# Patient Record
Sex: Female | Born: 1975
Health system: Southern US, Community
[De-identification: ages and names within clinical notes are randomized; demographics above are authoritative.]

## PROBLEM LIST (undated history)

## (undated) DIAGNOSIS — E039 Hypothyroidism, unspecified: Secondary | ICD-10-CM

## (undated) DIAGNOSIS — S42409A Unspecified fracture of lower end of unspecified humerus, initial encounter for closed fracture: Secondary | ICD-10-CM

## (undated) HISTORY — DX: Unspecified fracture of lower end of unspecified humerus, initial encounter for closed fracture: S42.409A

---

## 2001-08-26 ENCOUNTER — Other Ambulatory Visit: Admission: RE | Admit: 2001-08-26 | Discharge: 2001-08-26 | Payer: Self-pay | Admitting: Obstetrics and Gynecology

## 2004-10-14 ENCOUNTER — Emergency Department (HOSPITAL_COMMUNITY): Admission: EM | Admit: 2004-10-14 | Discharge: 2004-10-14 | Payer: Self-pay | Admitting: Family Medicine

## 2005-05-20 ENCOUNTER — Emergency Department (HOSPITAL_COMMUNITY): Admission: EM | Admit: 2005-05-20 | Discharge: 2005-05-20 | Payer: Self-pay | Admitting: Family Medicine

## 2010-03-21 ENCOUNTER — Encounter: Payer: Self-pay | Admitting: Family Medicine

## 2010-04-02 ENCOUNTER — Ambulatory Visit: Payer: Self-pay | Admitting: Family Medicine

## 2010-04-02 DIAGNOSIS — M545 Low back pain, unspecified: Secondary | ICD-10-CM | POA: Insufficient documentation

## 2010-04-02 DIAGNOSIS — Q828 Other specified congenital malformations of skin: Secondary | ICD-10-CM | POA: Insufficient documentation

## 2010-04-02 DIAGNOSIS — E669 Obesity, unspecified: Secondary | ICD-10-CM | POA: Insufficient documentation

## 2010-04-02 DIAGNOSIS — O99215 Obesity complicating the puerperium: Secondary | ICD-10-CM

## 2010-04-04 ENCOUNTER — Ambulatory Visit: Payer: Self-pay | Admitting: Family Medicine

## 2010-04-04 ENCOUNTER — Encounter: Payer: Self-pay | Admitting: Family Medicine

## 2010-04-14 ENCOUNTER — Encounter: Payer: Self-pay | Admitting: Family Medicine

## 2010-04-14 DIAGNOSIS — R87610 Atypical squamous cells of undetermined significance on cytologic smear of cervix (ASC-US): Secondary | ICD-10-CM | POA: Insufficient documentation

## 2010-04-14 LAB — CONVERTED CEMR LAB: Pap Smear: UNDETERMINED

## 2010-04-24 ENCOUNTER — Ambulatory Visit: Payer: Self-pay | Admitting: Family Medicine

## 2010-09-16 ENCOUNTER — Ambulatory Visit: Payer: Self-pay

## 2010-10-08 ENCOUNTER — Encounter: Payer: Self-pay | Admitting: Family Medicine

## 2010-10-08 ENCOUNTER — Ambulatory Visit
Admission: RE | Admit: 2010-10-08 | Discharge: 2010-10-08 | Payer: Self-pay | Source: Home / Self Care | Attending: Family Medicine | Admitting: Family Medicine

## 2010-10-08 LAB — CONVERTED CEMR LAB
Chlamydia, DNA Probe: NEGATIVE
GC Probe Amp, Genital: NEGATIVE
Platelets: 302 10*3/uL (ref 150–400)
RDW: 13 % (ref 11.5–15.5)

## 2010-10-09 ENCOUNTER — Telehealth: Payer: Self-pay | Admitting: Family Medicine

## 2010-10-10 ENCOUNTER — Ambulatory Visit: Admit: 2010-10-10 | Payer: Self-pay | Admitting: Family Medicine

## 2010-11-04 NOTE — Miscellaneous (Signed)
Summary: ROI  ROI   Imported By: Clydell Hakim 04/02/2010 14:48:30  _____________________________________________________________________  External Attachment:    Type:   Image     Comment:   External Document

## 2010-11-04 NOTE — Miscellaneous (Signed)
Summary: Preload outside records  Clinical Lists Changes On 09/12/09 had neg pelvic ultrasound for a postpartum female Observations: Added new observation of NKA: T (04/02/2010 12:54) Added new observation of ALLERGY REV: Done (04/02/2010 12:54) Added new observation of PELVIS US: normal:   (09/12/2009 12:56)      Pelvic US  Procedure date:  09/12/2009  Findings:      normal:     Pelvic US  Procedure date:  09/12/2009  Findings:      normal:      Allergies (verified): No Known Drug Allergies

## 2010-11-04 NOTE — Letter (Signed)
Summary: Results Follow-up Letter  [facility]  [address]   [place], [place] [address]   Phone: [phone]  Fax: [phone]    04/14/2010  [address] [place], [place]  [address]  Dear Ms. Aundria Rud,   The following are the results of your recent test(s):  Test     Result     Pap Smear    Normal_______  Not Normal__X___       Comments: This is a bit complicated and you may want to call me or Google ASC-US.  Your PAP smear was not exactly normal, but it also wasn't bad.  You have something called ASC-US which stands for atypical squamus cells of uncertain significants.  I went further and did HPV testing on the specimen and you do NOT have any high risk HPV.  As such, your pap smear should revert back to normal.  The recommendation is to follow up with another PAP smear next year.    Sincerely,     Doralee Albino MD [facility]

## 2010-11-04 NOTE — Assessment & Plan Note (Signed)
Summary: NP/nutr. appt/eo   Vital Signs:  Patient profile:   35 year old female Height:      66 inches Weight:      178.5 pounds BMI:     28.91  Vitals Entered By: Mackenzie Almas PHD (April 24, 2010 10:37 AM)  History of Present Illness: Assessment:  Spent 60 minutes with pt.   Mackenzie Boyd has a h/o body image issues and some disordered eating thougths and behaviors.  She said she has dieted most of her adult life, and is the heaviest she's ever been.  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  Usual eating pattern includes bkfast, lunch, snack, dinner, and snack.  Lunch is sometimes more of a snack.  Everyday foods/beverages include caffeine-free diet soda, 1/2 c coffee, 6 oz low-fat [place] yogurt, peanut or almond butter, apple, dessert-type foods (usually 2 X day), and some kind of greens.  Currently taking PNVs and DHA.  Usual exercise routine includes resistance training w/ personal trainer once a week plus a "couch to 5K" training program, 3 X wk.  Now up to 25 minutes of straight running.  24-hr recall suggests intake of  ~1100 kcal: B (10:30 AM)- 6 oz [place] yogurt, diet soda, 5-6 wheat thins with 1 tbsp almond butter; Snk (AM)- ; L (1 PM)- 2 tbsp pimiento chs & tomato on [employer], water; Snks (PM)- 3 small oatmeal-cc cookies, 6 oz 2% milk, Fiber One bar; D (PM)- salad w/ dressing, 4 oz 2% milk. Yesterday was atypical in that bkfst was very late, and she didn't eat much dinner b/c she didn't feel well.  Mackenzie Boyd identifies sweets as a problem food for her, and mindless eating as an obstacle to weight loss.    Nutrition Diagnosis:  Inappropriate intake of types of carbohydrate (NI-53.3) related to sweets as evidenced by desserts usually eaten 2 X day despite pt's interest in weight loss.  Physical inactivity (NB-2.1) related to energy deficit needed for wt loss as evidenced by exercise limited to a total of 3-4 X wk.  Excessive  frequency of unconscious food choices and eating.    Intervention: See Patient Instructions.    Monitoring/Eval:  Dietary intake, body weight, and exercise at 4-wk F/U.     Allergies: No Known Drug Allergies   Complete Medication List: 1)  Tretinoin 0.025 % Crea (Tretinoin) .... Apply once daily.  disp 60 gram tube  Other Orders: Inital Assessment Each - [facility] ([address])  Patient Instructions: 1)  Planning ahead will help ensure better nutrition and appetite management.   2)  Be mindful of what (tempting) foods you choose to keep on hand.   3)  Continue on your exercise routine, and as feasible, add some walking to your running so you are exercising at least 5 X wk.  Remember that even 10 minutes of exercise is beneficial.   4)  Eat at least 3 meals and 1-2 snacks per day.  5)  Obtain twice as many veg's as protein or carbohydrate foods for both lunch and dinner. 6)  Remember the importance of VEGETABLES:  Goal is at least twice a day.   7)  MEALS:  Should look like, taste like, and feel like a distinct meal.   8)  Review Mackenzie Boyd's (or other) books.   9)  Hungry, Angry, Anxious, Lonely, Tired (HAALT) = red flags for poor food decisions.   10)  Please schedule a follow-up  appt for 3-4 wks.  If no appt available, call Dr. Gerilyn Boyd:  [phone].

## 2010-11-04 NOTE — Assessment & Plan Note (Signed)
Summary: read tb/after 4 pm/eo  Nurse Visit   Allergies: No Known Drug Allergies  PPD Results    Date of reading: 04/04/2010    Results: 0 mm    Interpretation: negative  Orders Added: 1)  No Charge Patient Arrived (NCPA0) [NCPA0]

## 2010-11-04 NOTE — Miscellaneous (Signed)
Summary: Last tetanus  Clinical Lists Changes  Observations: Added new observation of DM PROGRESS: N/A (04/04/2010 11:27) Added new observation of DM FSREVIEW: N/A (04/04/2010 11:27) Added new observation of HTN PROGRESS: N/A (04/04/2010 11:27) Added new observation of HTN FSREVIEW: N/A (04/04/2010 11:27) Added new observation of LIPID PROGRS: N/A (04/04/2010 11:27) Added new observation of LIPID FSREVW: N/A (04/04/2010 11:27) Added new observation of TD BOOSTER: Historical (10/05/2004 11:28)  Immunization History:  Tetanus/Td Immunization History:    Tetanus/Td:  historical (10/05/2004)       Prevention & Chronic Care Immunizations   Influenza vaccine: Not documented    Tetanus booster: 10/05/2004: Historical    Pneumococcal vaccine: Not documented  Other Screening   Pap smear: Not documented   Smoking status: never  (04/02/2010)

## 2010-11-04 NOTE — Assessment & Plan Note (Signed)
Summary: NEW PT/WELL WOMAN VISIT/[facility]   Vital Signs:  Patient profile:   35 year old female Height:      66 inches Weight:      181.9 pounds BMI:     29.47 Temp:     99.2 degrees F oral Pulse rate:   87 / minute BP sitting:   115 / 75  (left arm) Cuff size:   regular  Vitals Entered By: Gladstone Pih (April 02, 2010 3:13 PM) CC: NP---CPE and pap Is Patient Diabetic? No Pain Assessment Patient in pain? no        CC:  NP---CPE and pap.  History of Present Illness: Weight concerns.   Sig post partum wt gain.    Wants to try accupuncture for wt gain and low back pain  Breastfeeding now 22 month old son.  Wants Rx for keratosis pilaris  Last Pap 11/2008 Had hx of abnormal pap in 2007 but has three subsequent normals  Has record of last tetanus at home,  thinks in last 10 years.  Will check and call me with date.  Never cholesterol screen.  Had HIV screen with recent pregnancy    Habits & Providers  Alcohol-Tobacco-Diet     Alcohol drinks/day: <1     Alcohol Counseling: not indicated; patient does not drink     Tobacco Status: never     Diet Comments: trying to be healthy wants to see nutritionist  Exercise-Depression-Behavior     Does Patient Exercise: yes     Type of exercise: walking and jog     Exercise (avg: min/session): 30-60     Times/week: 3     Have you felt down or hopeless? no     Have you felt little pleasure in things? no     Depression Counseling: not indicated; screening negative for depression     STD Risk: never     Drug Use: never     Seat Belt Use: always     Sun Exposure: infrequent  Problems Prior to Update: 1)  Screening For Lipoid Disorders  (ICD-V77.91) 2)  Keratosis Pilaris  (ICD-757.39) 3)  Low Back Pain  (ICD-724.2) 4)  Obes Comp Pg Birth/the Pp Postpartum Cond/comp  (XBJ-478.29)  Medications Prior to Update: 1)  None  Current Medications (verified): 1)  Tretinoin 0.025 % Crea (Tretinoin) .... Apply Once Daily.  Disp 60 Gram  Tube  Allergies (verified): No Known Drug Allergies  Past History:  Past Medical History: none  Past Surgical History: Had Leep for abnormal pap age 71.    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    Physical Exam  General:  Well-developed,well-nourished,in no acute distress; alert,appropriate and cooperative throughout examination Head:  Normocephalic and atraumatic without obvious abnormalities. No apparent alopecia or balding. Mouth:  Oral mucosa and oropharynx without lesions or exudates.  Teeth in good repair. Neck:  No deformities, masses, or tenderness noted. Lungs:  Normal respiratory effort, chest expands symmetrically. Lungs are clear to auscultation, no crackles or wheezes. Heart:  Normal rate and regular rhythm. S1 and S2 normal without gallop, murmur, click, rub or  other extra sounds. Abdomen:  Bowel sounds positive,abdomen soft and non-tender without masses, organomegaly or hernias noted. Genitalia:  Normal introitus for age, no external lesions, no vaginal discharge, mucosa pink and moist, no vaginal or cervical lesions, no vaginal atrophy, no friaility or hemorrhage, normal uterus size and position, no adnexal masses or tenderness Extremities:  No clubbing, cyanosis, edema, or deformity noted with normal full range of motion of all joints.   Neurologic:  No cranial nerve deficits noted. Station and gait are normal. Plantar reflexes are down-going bilaterally. DTRs are symmetrical throughout. Sensory, motor and coordinative functions appear  intact.   Impression & Recommendations:  Problem # 1:  SCREENING FOR MALIGNANT NEOPLASM OF THE CERVIX (ICD-V76.2)  Orders: Pap Smear-[facility] ([id number])  Problem # 2:  SCREENING FOR LIPOID DISORDERS (ICD-V77.91)  Future Orders: Lipid-[facility] ([id number]) ... 03/06/2011  Problem # 3:  OBES COMP PG BIRTH/THE PP POSTPARTUM COND/COMP (QIO-962.95)  Orders: Nutrition Referral (Nutrition)  Problem # 4:  LOW BACK PAIN (ICD-724.2) accupuncture referral  Orders: Alternative Medicine Referral (Alt Med)  Complete Medication List: 1)  Tretinoin 0.025 % Crea (Tretinoin) .... Apply once daily.  disp 60 gram tube  Other Orders: TB Skin Test (28413) Admin 1st Vaccine (24401) Prescriptions: TRETINOIN 0.025 % CREA (TRETINOIN) Apply once daily.  Disp 60 gram tube  #60 x 12   Entered and Authorized by:   Doralee Albino MD   Signed by:   Doralee Albino MD on 04/02/2010   Method used:   [facility]        [facility]  [address] [facility] [address] [phone]* (retail)       [address]       [place], [place]  [address]       Ph: [phone] or [phone]       Fax: [phone]   RxID:   [id number]    Immunizations Administered:  PPD Skin Test:    Vaccine Type: PPD    Site: left forearm    Mfr: Sanofi Pasteur    Dose: 0.1 ml    Route: ID    Given by: Gladstone Pih    Exp. Date: 07/18/2011    Lot #: [id number]    Physician counseled: yes   Prevention & Chronic Care Immunizations   Influenza vaccine: Not documented    Tetanus booster: Not documented    Pneumococcal vaccine: Not documented  Other Screening   Pap smear: Not documented   Smoking status: never  (04/02/2010)   Appended Document: Orders Update-HPV charge    Clinical Lists Changes  Problems: Added new problem of ASCUS PAP (ICD-795.01) Orders: Added new Test order of HPV Typing-[facility] [phone]) - Signed

## 2010-11-06 ENCOUNTER — Encounter: Payer: Self-pay | Admitting: *Deleted

## 2010-11-06 NOTE — Progress Notes (Signed)
  Phone Note Outgoing Call Call back at [facility] Phone [phone]   Call placed by: Paula Compton MD,  October 09, 2010 12:15 PM Call placed to: Patient Summary of Call: Called patient, reported results of labs and cervical cultures (negative).  She reports resolution of the vaginal symptoms today after taking the Diflucan yesterday.  Will follow with her primary for concerns of hair loss and others. Initial call taken by: Paula Compton MD,  October 09, 2010 12:16 PM

## 2010-11-06 NOTE — Assessment & Plan Note (Signed)
Summary: vaginal infection/bmc   Vital Signs:  Patient profile:   35 year old female Height:      66 inches Weight:      174 pounds BMI:     28.19 Temp:     97.6 degrees F oral Pulse rate:   93 / minute BP sitting:   130 / 87  (left arm) Cuff size:   regular  Vitals Entered By: Tessie Fass CMA (October 08, 2010 10:53 AM)  Serial Vital Signs/Assessments:  Time      Position  BP       Pulse  Resp  Temp     By                     104/70                         Paula Compton MD  Comments: R arm sitting manual reading By: Paula Compton MD     History of Present Illness: Patient seen for acute visit, complaint of stinging sensation in vagina for about 2 days.  No notable discharge or odor.  No dysuria or change in urinary pattern or appearance.  No dyspareunia.  LMP Sep 13, 2010.  Not using hormonal forms of birth control.  Nursing her 28 month old son.  No prior diagnoses of STI, HSV2, vaginal yeast or BV.   Did have hx LEEP procedure in the past; most recent PAP about 6 months ago with ASCUS, without high-risk HPV at that time.  In addition to the above-mentioned complaints, Ms. Butsch brings up several other concerns:  1) feelings of anxiety and depression.  Is working to complete PhD dissertation in mental health, recognizes sxs in herself.  Denies SI or HI.  Poor sleep associated with her son's poor sleeping patterns.  Is able to cope, but "everything's a slog".  Some racing thoughts.  2) Hair loss, mostly over the forehead/hairline.  No scalp itch or excessive dandruff.  No family hx of thyroid disease.  Unable to shed the pregnancy weight gain, trying weight watchers for a few months without success.    Allergies: No Known Drug Allergies  Physical Exam  General:  well appearing, no apparent distress Head:  scalp without exclamation point hairs or patches of hair loss.  No abnormalities of scalp noted Eyes:  PERRL Neck:  Neck supple. No thyroid masses or megaly. No  cervical adenopathy Genitalia:  normal female genitalia.  Mild erythema of vaginal mucosa at vaginal vault.  Scant amount of clumpy white discharge.  No other mucosal  lesions, no vesicular lesions on external skin   Impression & Recommendations:  Problem # 1:  LEUKORRHEA NOT SPECIFIED AS INFECTIVE (ICD-623.5) Wet prep with yeast and some moderate clue cells.  Will opt to treat the yeast first, consider treatment with metronidazole if sxs not resolved.  I suspect the BV is an incidental finding at this point.  [facility] [phone]) GC/Chlamydia-[facility] (87591/87491) [facility]- Est  Level 4 ([address]782)  Problem # 2:  UNSPECIFIED ALOPECIA (ICD-704.00) Suspect telogen effluvium related to the multiple stressors that are present in her life (51-month old, poor sleep, PhD dissertation, possible situational depression or other mood disorder).  Will check TSH and CBC today, I will follow up with her on these.  She agrees to see Dr Leveda Anna for management of ongoing issues.   Orders: TSH-[facility] ([id number]) CBC-[facility] (78469) [facility]- Est  Level 4 (62952)  Problem # 3:  ASCUS PAP (ICD-795.01)  She agrees to followup for repeat PAP with Dr Leveda Anna in [place].  Abnormal PAP on next check warrants colposcopic evaluation.  Orders: [facility]- Est  Level 4 ([address]214)  Complete Medication List: 1)  Tretinoin 0.025 % Crea (Tretinoin) .... Apply once daily.  disp 60 gram tube 2)  Fluconazole 150 Mg Tabs (Fluconazole) .... Sig: take 1 tab by mouth one time only; may repeat in 1 week as needed  Patient Instructions: 1)  It was a pleasure to see you today.  I will follow up with you about the results of the labwork collected today.  2)  I ask that you followup wtih Dr Leveda Anna for continuity of care, and for the repeat PAP smear due in June of 2012. Prescriptions: FLUCONAZOLE 150 MG TABS (FLUCONAZOLE) SIG: Take 1 tab by mouth one time only; may repeat in 1 week as needed  #2 x 0   Entered and Authorized by:   Paula Compton MD    Signed by:   Paula Compton MD on 10/08/2010   Method used:   [facility]        [facility] [facility] [address] [phone]* (retail)       [address]       [place], [place]  [address]       Ph: [phone] or [phone]       Fax: [phone]   RxID:   [id number]    Orders Added: 1)  TSH-[facility] [phone] 2)  CBC-[facility] [85027] 3)  Wet Prep- [facility] [87210] 4)  GC/Chlamydia-[facility] [87591/87491] 5)  [facility]- Est  Level 4 [33295]    Laboratory Results  Date/Time Received: October 08, 2010 11:42 AM  Date/Time Reported: October 08, 2010 11:52 AM   Wet [place] Source: vag WBC/hpf: 1-5 Bacteria/hpf: 3+ rods and cocci Clue cells/hpf: moderate  Positive whiff Yeast/hpf: many Trichomonas/hpf: none Comments: ...............test performed by......Marland KitchenBonnie A. [place], MLS (ASCP)cm

## 2012-02-02 ENCOUNTER — Other Ambulatory Visit: Payer: Self-pay | Admitting: Family Medicine

## 2012-02-02 DIAGNOSIS — Q828 Other specified congenital malformations of skin: Secondary | ICD-10-CM

## 2012-02-02 MED ORDER — TRETINOIN 0.025 % EX CREA: TOPICAL_CREAM | Freq: Every day | CUTANEOUS | Status: DC

## 2012-02-02 NOTE — Assessment & Plan Note (Signed)
Refilled based on fax request.  Discussed with patient and she will see me soon.

## 2012-03-15 ENCOUNTER — Telehealth: Payer: Self-pay | Admitting: *Deleted

## 2012-03-15 NOTE — Telephone Encounter (Signed)
Form completed.

## 2012-03-15 NOTE — Telephone Encounter (Signed)
Faxed

## 2012-03-15 NOTE — Telephone Encounter (Signed)
PA  required for tretinoin.  Called pharmacy and they still have on file , waiting for PA because it still will not go through. Placed in MD box. ( Dr. Leveda Anna thought he had already done this previously )

## 2012-03-16 NOTE — Telephone Encounter (Signed)
Approval received for tretinoin .  Pharmacy notified.

## 2013-10-05 NOTE — L&D Delivery Note (Signed)
Delivery Note At 6:47 PM a viable and healthy female was delivered via Vaginal, Spontaneous Delivery (Presentation: OA;  ).  APGAR: 9, 9; weight 8 lb 11 oz (3941 g).   Placenta status: Intact, Spontaneous.  Cord: 3 vessels with the following complications: None.  Cord pH: NA Anesthesia: Epidural  Episiotomy: None Lacerations: 2nd degree Suture Repair: 3.0 vicryl rapide Est. Blood Loss (mL):   Mom to postpartum.  Baby to Couplet care / Skin to Skin.  Mackenzie Boyd R 04/30/2014, 8:56 PM

## 2013-11-15 ENCOUNTER — Ambulatory Visit (INDEPENDENT_AMBULATORY_CARE_PROVIDER_SITE_OTHER): Payer: BC Managed Care – PPO | Admitting: Sports Medicine

## 2013-11-15 VITALS — BP 121/75 | HR 106 | Temp 98.5°F | Ht 66.0 in | Wt 182.0 lb

## 2013-11-15 DIAGNOSIS — J111 Influenza due to unidentified influenza virus with other respiratory manifestations: Secondary | ICD-10-CM

## 2013-11-15 DIAGNOSIS — Z3480 Encounter for supervision of other normal pregnancy, unspecified trimester: Secondary | ICD-10-CM

## 2013-11-15 DIAGNOSIS — R69 Illness, unspecified: Principal | ICD-10-CM

## 2013-11-15 DIAGNOSIS — Z3482 Encounter for supervision of other normal pregnancy, second trimester: Secondary | ICD-10-CM

## 2013-11-15 LAB — INFLUENZA A AND B
INFLUENZA B AG: NEGATIVE
Inflenza A Ag: POSITIVE — AB

## 2013-11-15 MED ORDER — OSELTAMIVIR PHOSPHATE 75 MG PO CAPS: 75.0000 mg | ORAL_CAPSULE | Freq: Two times a day (BID) | ORAL | Status: DC

## 2013-11-15 NOTE — Assessment & Plan Note (Signed)
Currently [redacted] weeks pregnant.  Tamiflu Category C; pt seen by OB earlier today and would like testing performed.   Flu Test - sent STAT Tamiflu prescribed so will be available.  Pt to wait to pick up. Will call later with results. Continue supportive care with fluids and acetaminophen

## 2013-11-15 NOTE — Progress Notes (Signed)
  Mackenzie Boyd - 38 y.o. female MRN [id number]  Date of birth: 03/24/1976  Chief Complaint  Patient presents with  . flu like symptoms    x 1 day   Problems/Dx addressed during visit General Plan and Pt Instructions  1. Influenza-like illness   2. Prenatal care, subsequent pregnancy in second trimester   3. OBES COMP PG BIRTH/THE PP POSTPARTUM COND/COMP        Tamiflu     HPI & ROS    She reports less than 48 hours of body aches, fevers, chills.  Her husband was recently diagnosed with influenza and has been empirically treated with Tamiflu.  She is approximately [redacted] weeks pregnant.  Seen by her OB/GYN earlier today who requested she undergo influenza testing prior to starting Tamiflu.  Denies any bleeding, discharge.  Uncomplicated pregnancy thus far.  Taking Tylenol only, no NSAIDs.  Hydration status is good.  No dysuria, frequency or burning.  History  Past Medical, Surgical, Social, and Family History Reviewed per EMR Medications and Allergies reviewed and all updated if necessary. Objective Findings  VITALS: HR: 106 bpm  BP: 121/75 mmHg  TEMP: 98.5 F (36.9 C) (Oral)  RESP:    HT: 5\' 6"  (167.6 cm)  WT: 182 lb (82.555 kg)  BMI: 29.4   BP Readings from Last 3 Encounters:  11/15/13 121/75  10/08/10 130/87  04/02/10 115/75   Wt Readings from Last 3 Encounters:  11/15/13 182 lb (82.555 kg)  10/08/10 174 lb (78.926 kg)  04/24/10 178 lb 8 oz (80.967 kg)     PHYSICAL EXAM: GENERAL:  adult ill appearing female. In no discomfort; no respiratory distress, mildly pale   PSYCH: alert and appropriate, good insight   HNEENT:  no JVD, nasal erythema with mild discharge,   CARDIO:  tachycardic   LUNGS:  no increased work of breathing   ABDOMEN:   EXTREM:    GU:  fetal heart tones ranging from 160-165   SKIN:  good skin turgor     Medications, Labs & Other Orders   Previous Medications   FLUCONAZOLE (DIFLUCAN) 150 MG TABLET    Take 150 mg by mouth once. May  repeat in 1 wk prn    TRETINOIN (RETIN-A) 0.025 % CREAM    Apply topically daily.   Modified Medications   No medications on file   New Prescriptions   OSELTAMIVIR (TAMIFLU) 75 MG CAPSULE    Take 1 capsule (75 mg total) by mouth 2 (two) times daily.   Discontinued Medications   No medications on file   Orders Placed This Encounter  Procedures  . Influenza a and b   Assessment & Plan  As above & for further discussion see problem based charting if applicable.

## 2013-11-16 DIAGNOSIS — Z3482 Encounter for supervision of other normal pregnancy, second trimester: Secondary | ICD-10-CM | POA: Insufficient documentation

## 2013-12-08 ENCOUNTER — Ambulatory Visit (INDEPENDENT_AMBULATORY_CARE_PROVIDER_SITE_OTHER): Payer: BC Managed Care – PPO | Admitting: Family Medicine

## 2013-12-08 ENCOUNTER — Encounter: Payer: Self-pay | Admitting: Family Medicine

## 2013-12-08 VITALS — BP 135/80 | HR 83 | Temp 98.9°F | Ht 66.0 in | Wt 189.0 lb

## 2013-12-08 DIAGNOSIS — G43909 Migraine, unspecified, not intractable, without status migrainosus: Secondary | ICD-10-CM | POA: Insufficient documentation

## 2013-12-08 DIAGNOSIS — O0990 Supervision of high risk pregnancy, unspecified, unspecified trimester: Secondary | ICD-10-CM | POA: Insufficient documentation

## 2013-12-08 NOTE — Progress Notes (Signed)
Patient ID: Mackenzie Boyd, female   DOB: 10/20/1975, 38 y.o.   MRN: [id number]   Subjective:  HPI:   Mackenzie RoamJennifer L Danser is a 38 y.o. female G2P1001 at 1719wks with h/o LEEP and hypothyroidism here for sudden onset headache.   She reports an uncomplicated pregnancy [facility], but had sudden onset of constant, 10/10, retro-orbital pain radiating to the R temple, cheek, and forehead. She also has some mild blurring of vision. Tylenol and mucinex did not help. She has no history of glaucoma or migraines, and had no headaches, or pre-eclampsia with her previous pregnancy. She was recently treated for the flu but has recovered from that. She denies fevers, tooth pain, history of tooth abscess, RUQ pain, LE swelling. Feeling GFM, no vaginal bleeding or discharge, no loss of fluid.   Review of Systems:  Per HPI. All other systems reviewed and are negative. Objective:  Physical Exam: BP 135/80  Pulse 83  Temp(Src) 98.9 F (37.2 C) (Oral)  Ht 5\' 6"  (1.676 m)  Wt 189 lb (85.73 kg)  BMI 30.52 kg/m2  Gen: Gravid 38 y.o. female in NAD HEENT: MMM, EOMI, PERRL, no signs of cellulitis; No focal tenderness over sinuses or temple. Turbinates erythematous and edematous.  CV: RRR, no MRG, no JVD Resp: Non-labored, CTAB, no wheezes noted Abd: Soft, appropriately gravid near umbilicus. NTND, BS present, no guarding or organomegaly MSK: No edema noted, full ROM Neuro: Alert and oriented but very tearful in extreme pain. Speech normal. Non-focal.     Pt left before FHTs could be assessed.  Assessment:     Mackenzie RoamJennifer L Schrodt is a 38 y.o. female here for migraine headache vs. Pregnancy-associated headache.     Plan:  Planned to give decadron and benadryl for migraine tx and then reassess. Pt uneasy about taking any medications.  Sent to [facility]U for more intensive monitoring, fluids, IV tx and labs.   Pt refused medications.

## 2014-01-25 ENCOUNTER — Other Ambulatory Visit: Payer: Self-pay | Admitting: Dermatology

## 2014-02-08 LAB — OB RESULTS CONSOLE HEPATITIS B SURFACE ANTIGEN: Hepatitis B Surface Ag: NEGATIVE

## 2014-02-08 LAB — OB RESULTS CONSOLE ABO/RH: RH Type: POSITIVE

## 2014-02-08 LAB — OB RESULTS CONSOLE HIV ANTIBODY (ROUTINE TESTING): HIV: NONREACTIVE

## 2014-02-08 LAB — OB RESULTS CONSOLE RPR: RPR: NONREACTIVE

## 2014-02-08 LAB — OB RESULTS CONSOLE RUBELLA ANTIBODY, IGM: RUBELLA: IMMUNE

## 2014-02-08 LAB — OB RESULTS CONSOLE ANTIBODY SCREEN: Antibody Screen: NEGATIVE

## 2014-02-08 LAB — OB RESULTS CONSOLE GC/CHLAMYDIA
Chlamydia: NEGATIVE
GC PROBE AMP, GENITAL: NEGATIVE

## 2014-03-22 LAB — OB RESULTS CONSOLE GBS: GBS: NEGATIVE

## 2014-04-23 ENCOUNTER — Inpatient Hospital Stay (HOSPITAL_COMMUNITY)
Admission: AD | Admit: 2014-04-23 | Discharge: 2014-04-23 | Disposition: A | Discharge status: Home / Self Care | Payer: BC Managed Care – PPO | Source: Ambulatory Visit | Attending: Obstetrics | Admitting: Obstetrics

## 2014-04-23 ENCOUNTER — Encounter (HOSPITAL_COMMUNITY): Payer: Self-pay | Admitting: General Practice

## 2014-04-23 DIAGNOSIS — Z3689 Encounter for other specified antenatal screening: Secondary | ICD-10-CM

## 2014-04-23 DIAGNOSIS — O9989 Other specified diseases and conditions complicating pregnancy, childbirth and the puerperium: Principal | ICD-10-CM

## 2014-04-23 DIAGNOSIS — Z6790 Unspecified blood type, Rh positive: Secondary | ICD-10-CM

## 2014-04-23 DIAGNOSIS — O99891 Other specified diseases and conditions complicating pregnancy: Secondary | ICD-10-CM | POA: Insufficient documentation

## 2014-04-23 DIAGNOSIS — S3991XA Unspecified injury of abdomen, initial encounter: Secondary | ICD-10-CM

## 2014-04-23 DIAGNOSIS — IMO0002 Reserved for concepts with insufficient information to code with codable children: Secondary | ICD-10-CM | POA: Insufficient documentation

## 2014-04-23 DIAGNOSIS — Y929 Unspecified place or not applicable: Secondary | ICD-10-CM | POA: Insufficient documentation

## 2014-04-23 HISTORY — DX: Hypothyroidism, unspecified: E03.9

## 2014-04-23 NOTE — Discharge Instructions (Signed)
Braxton Hicks Contractions °Contractions of the uterus can occur throughout pregnancy. Contractions are not always a sign that you are in labor.  °WHAT ARE BRAXTON HICKS CONTRACTIONS?  °Contractions that occur before labor are called Braxton Hicks contractions, or false labor. Toward the end of pregnancy (32-34 weeks), these contractions can develop more often and may become more forceful. This is not true labor because these contractions do not result in opening (dilatation) and thinning of the cervix. They are sometimes difficult to tell apart from true labor because these contractions can be forceful and people have different pain tolerances. You should not feel embarrassed if you go to the hospital with false labor. Sometimes, the only way to tell if you are in true labor is for your health care provider to look for changes in the cervix. °If there are no prenatal problems or other health problems associated with the pregnancy, it is completely safe to be sent home with false labor and await the onset of true labor. °HOW CAN YOU TELL THE DIFFERENCE BETWEEN TRUE AND FALSE LABOR? °False Labor °· The contractions of false labor are usually shorter and not as hard as those of true labor.   °· The contractions are usually irregular.   °· The contractions are often felt in the front of the lower abdomen and in the groin.   °· The contractions may go away when you walk around or change positions while lying down.   °· The contractions get weaker and are shorter lasting as time goes on.   °· The contractions do not usually become progressively stronger, regular, and closer together as with true labor.   °True Labor °· Contractions in true labor last 30-70 seconds, become very regular, usually become more intense, and increase in frequency.   °· The contractions do not go away with walking.   °· The discomfort is usually felt in the top of the uterus and spreads to the lower abdomen and low back.   °· True labor can be  determined by your health care provider with an exam. This will show that the cervix is dilating and getting thinner.   °WHAT TO REMEMBER °· Keep up with your usual exercises and follow other instructions given by your health care provider.   °· Take medicines as directed by your health care provider.   °· Keep your regular prenatal appointments.   °· Eat and drink lightly if you think you are going into labor.   °· If Braxton Hicks contractions are making you uncomfortable:   °¨ Change your position from lying down or resting to walking, or from walking to resting.   °¨ Sit and rest in a tub of warm water.   °¨ Drink 2-3 glasses of water. Dehydration may cause these contractions.   °¨ Do slow and deep breathing several times an hour.   °WHEN SHOULD I SEEK IMMEDIATE MEDICAL CARE? °Seek immediate medical care if: °· Your contractions become stronger, more regular, and closer together.   °· You have fluid leaking or gushing from your vagina.   °· You have a fever.   °· You pass blood-tinged mucus.   °· You have vaginal bleeding.   °· You have continuous abdominal pain.   °· You have low back pain that you never had before.   °· You feel your baby's head pushing down and causing pelvic pressure.   °· Your baby is not moving as much as it used to.   °Document Released: 09/21/2005 Document Revised: 09/26/2013 Document Reviewed: 07/03/2013 °ExitCare® Patient Information ©2015 [employer]. This information is not intended to replace advice given to you by your health care   provider. Make sure you discuss any questions you have with your health care provider. ° °Fetal Movement Counts °Patient Name: __________________________________________________ Patient Due Date: ____________________ °Performing a fetal movement count is highly recommended in high-risk pregnancies, but it is good for every pregnant woman to do. Your caregiver may ask you to start counting fetal movements at 28 weeks of the pregnancy. Fetal movements  often increase: °· After eating a full meal. °· After physical activity. °· After eating or drinking something sweet or cold. °· At rest. °Pay attention to when you feel the baby is most active. This will help you notice a pattern of your baby's sleep and wake cycles and what factors contribute to an increase in fetal movement. It is important to perform a fetal movement count at the same time each day when your baby is normally most active.  °HOW TO COUNT FETAL MOVEMENTS °1. Find a quiet and comfortable area to sit or lie down on your left side. Lying on your left side provides the best blood and oxygen circulation to your baby. °2. Write down the day and time on a sheet of paper or in a journal. °3. Start counting kicks, flutters, swishes, rolls, or jabs in a 2 hour period. You should feel at least 10 movements within 2 hours. °4. If you do not feel 10 movements in 2 hours, wait 2-3 hours and count again. Look for a change in the pattern or not enough counts in 2 hours. °SEEK MEDICAL CARE IF: °· You feel less than 10 counts in 2 hours, tried twice. °· There is no movement in over an hour. °· The pattern is changing or taking longer each day to reach 10 counts in 2 hours. °· You feel the baby is not moving as he or she usually does. °Date: ____________ Movements: ____________ Start time: ____________ Finish time: ____________  °Date: ____________ Movements: ____________ Start time: ____________ Finish time: ____________ °Date: ____________ Movements: ____________ Start time: ____________ Finish time: ____________ °Date: ____________ Movements: ____________ Start time: ____________ Finish time: ____________ °Date: ____________ Movements: ____________ Start time: ____________ Finish time: ____________ °Date: ____________ Movements: ____________ Start time: ____________ Finish time: ____________ °Date: ____________ Movements: ____________ Start time: ____________ Finish time: ____________ °Date: ____________  Movements: ____________ Start time: ____________ Finish time: ____________  °Date: ____________ Movements: ____________ Start time: ____________ Finish time: ____________ °Date: ____________ Movements: ____________ Start time: ____________ Finish time: ____________ °Date: ____________ Movements: ____________ Start time: ____________ Finish time: ____________ °Date: ____________ Movements: ____________ Start time: ____________ Finish time: ____________ °Date: ____________ Movements: ____________ Start time: ____________ Finish time: ____________ °Date: ____________ Movements: ____________ Start time: ____________ Finish time: ____________ °Date: ____________ Movements: ____________ Start time: ____________ Finish time: ____________  °Date: ____________ Movements: ____________ Start time: ____________ Finish time: ____________ °Date: ____________ Movements: ____________ Start time: ____________ Finish time: ____________ °Date: ____________ Movements: ____________ Start time: ____________ Finish time: ____________ °Date: ____________ Movements: ____________ Start time: ____________ Finish time: ____________ °Date: ____________ Movements: ____________ Start time: ____________ Finish time: ____________ °Date: ____________ Movements: ____________ Start time: ____________ Finish time: ____________ °Date: ____________ Movements: ____________ Start time: ____________ Finish time: ____________  °Date: ____________ Movements: ____________ Start time: ____________ Finish time: ____________ °Date: ____________ Movements: ____________ Start time: ____________ Finish time: ____________ °Date: ____________ Movements: ____________ Start time: ____________ Finish time: ____________ °Date: ____________ Movements: ____________ Start time: ____________ Finish time: ____________ °Date: ____________ Movements: ____________ Start time: ____________ Finish time: ____________ °Date: ____________ Movements: ____________ Start time:  ____________ Finish time: ____________ °Date: ____________ Movements: ____________   Start time: ____________ Finish time: ____________  °Date: ____________ Movements: ____________ Start time: ____________ Finish time: ____________ °Date: ____________ Movements: ____________ Start time: ____________ Finish time: ____________ °Date: ____________ Movements: ____________ Start time: ____________ Finish time: ____________ °Date: ____________ Movements: ____________ Start time: ____________ Finish time: ____________ °Date: ____________ Movements: ____________ Start time: ____________ Finish time: ____________ °Date: ____________ Movements: ____________ Start time: ____________ Finish time: ____________ °Date: ____________ Movements: ____________ Start time: ____________ Finish time: ____________  °Date: ____________ Movements: ____________ Start time: ____________ Finish time: ____________ °Date: ____________ Movements: ____________ Start time: ____________ Finish time: ____________ °Date: ____________ Movements: ____________ Start time: ____________ Finish time: ____________ °Date: ____________ Movements: ____________ Start time: ____________ Finish time: ____________ °Date: ____________ Movements: ____________ Start time: ____________ Finish time: ____________ °Date: ____________ Movements: ____________ Start time: ____________ Finish time: ____________ °Date: ____________ Movements: ____________ Start time: ____________ Finish time: ____________  °Date: ____________ Movements: ____________ Start time: ____________ Finish time: ____________ °Date: ____________ Movements: ____________ Start time: ____________ Finish time: ____________ °Date: ____________ Movements: ____________ Start time: ____________ Finish time: ____________ °Date: ____________ Movements: ____________ Start time: ____________ Finish time: ____________ °Date: ____________ Movements: ____________ Start time: ____________ Finish time: ____________ °Date:  ____________ Movements: ____________ Start time: ____________ Finish time: ____________ °Date: ____________ Movements: ____________ Start time: ____________ Finish time: ____________  °Date: ____________ Movements: ____________ Start time: ____________ Finish time: ____________ °Date: ____________ Movements: ____________ Start time: ____________ Finish time: ____________ °Date: ____________ Movements: ____________ Start time: ____________ Finish time: ____________ °Date: ____________ Movements: ____________ Start time: ____________ Finish time: ____________ °Date: ____________ Movements: ____________ Start time: ____________ Finish time: ____________ °Date: ____________ Movements: ____________ Start time: ____________ Finish time: ____________ °Document Released: 10/21/2006 Document Revised: 09/07/2012 Document Reviewed: 07/18/2012 °[employer]® Patient Information ©2015 [employer]. This information is not intended to replace advice given to you by your health care provider. Make sure you discuss any questions you have with your health care provider. ° °
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
## 2014-04-30 ENCOUNTER — Encounter (HOSPITAL_COMMUNITY): Payer: Self-pay | Admitting: *Deleted

## 2014-04-30 ENCOUNTER — Inpatient Hospital Stay (HOSPITAL_COMMUNITY)
Admission: AD | Admit: 2014-04-30 | Discharge: 2014-05-02 | DRG: 775 | Disposition: A | Discharge status: Home / Self Care | Payer: BC Managed Care – PPO | Source: Ambulatory Visit | Attending: Obstetrics & Gynecology | Admitting: Obstetrics & Gynecology

## 2014-04-30 ENCOUNTER — Inpatient Hospital Stay (HOSPITAL_COMMUNITY): Payer: BC Managed Care – PPO | Admitting: Anesthesiology

## 2014-04-30 ENCOUNTER — Encounter (HOSPITAL_COMMUNITY): Payer: BC Managed Care – PPO | Admitting: Anesthesiology

## 2014-04-30 DIAGNOSIS — IMO0001 Reserved for inherently not codable concepts without codable children: Secondary | ICD-10-CM

## 2014-04-30 DIAGNOSIS — O09529 Supervision of elderly multigravida, unspecified trimester: Secondary | ICD-10-CM | POA: Diagnosis present

## 2014-04-30 DIAGNOSIS — O99284 Endocrine, nutritional and metabolic diseases complicating childbirth: Secondary | ICD-10-CM

## 2014-04-30 DIAGNOSIS — E039 Hypothyroidism, unspecified: Secondary | ICD-10-CM | POA: Diagnosis present

## 2014-04-30 DIAGNOSIS — E079 Disorder of thyroid, unspecified: Secondary | ICD-10-CM | POA: Diagnosis present

## 2014-04-30 DIAGNOSIS — D649 Anemia, unspecified: Secondary | ICD-10-CM | POA: Diagnosis present

## 2014-04-30 DIAGNOSIS — O9903 Anemia complicating the puerperium: Secondary | ICD-10-CM | POA: Diagnosis present

## 2014-04-30 LAB — POCT FERN TEST: POCT Fern Test: POSITIVE

## 2014-04-30 LAB — ABO/RH: ABO/RH(D): O POS

## 2014-04-30 LAB — CBC
HCT: 36.7 % (ref 36.0–46.0)
Hemoglobin: 12.7 g/dL (ref 12.0–15.0)
MCH: 29.7 pg (ref 26.0–34.0)
MCHC: 34.6 g/dL (ref 30.0–36.0)
MCV: 85.9 fL (ref 78.0–100.0)
Platelets: 208 10*3/uL (ref 150–400)
RBC: 4.27 MIL/uL (ref 3.87–5.11)
RDW: 14.8 % (ref 11.5–15.5)
WBC: 11.4 10*3/uL — ABNORMAL HIGH (ref 4.0–10.5)

## 2014-04-30 LAB — SYPHILIS: RPR W/REFLEX TO RPR TITER AND TREPONEMAL ANTIBODIES, TRADITIONAL SCREENING AND DIAGNOSIS ALGORITHM

## 2014-04-30 MED ORDER — FLEET ENEMA 7-19 GM/118ML RE ENEM
1.0000 | ENEMA | RECTAL | Status: DC | PRN
Start: 2014-04-30 — End: 2014-05-01

## 2014-04-30 MED ORDER — ACETAMINOPHEN 325 MG PO TABS: 650.0000 mg | ORAL_TABLET | ORAL | Status: DC | PRN

## 2014-04-30 MED ORDER — IBUPROFEN 600 MG PO TABS
600.0000 mg | ORAL_TABLET | Freq: Four times a day (QID) | ORAL | Status: DC | PRN
Start: 2014-04-30 — End: 2014-05-01
  Filled 2014-04-30: qty 1

## 2014-04-30 MED ORDER — WITCH HAZEL-GLYCERIN EX PADS: 1.0000 "application " | MEDICATED_PAD | CUTANEOUS | Status: DC | PRN

## 2014-04-30 MED ORDER — OXYCODONE-ACETAMINOPHEN 5-325 MG PO TABS: 1.0000 | ORAL_TABLET | ORAL | Status: DC | PRN

## 2014-04-30 MED ORDER — TERBUTALINE SULFATE 1 MG/ML IJ SOLN: 0.2500 mg | Freq: Once | INTRAMUSCULAR | Status: AC | PRN

## 2014-04-30 MED ORDER — ONDANSETRON HCL 4 MG/2ML IJ SOLN: 4.0000 mg | Freq: Four times a day (QID) | INTRAMUSCULAR | Status: DC | PRN

## 2014-04-30 MED ORDER — SIMETHICONE 80 MG PO CHEW: 80.0000 mg | CHEWABLE_TABLET | ORAL | Status: DC | PRN

## 2014-04-30 MED ORDER — OXYTOCIN 40 UNITS IN LACTATED RINGERS INFUSION - SIMPLE MED
1.0000 m[IU]/min | INTRAVENOUS | Status: DC
  Administered 2014-04-30: 2 m[IU]/min via INTRAVENOUS
  Filled 2014-04-30: qty 1000

## 2014-04-30 MED ORDER — TETANUS-DIPHTH-ACELL PERTUSSIS 5-2.5-18.5 LF-MCG/0.5 IM SUSP: 0.5000 mL | Freq: Once | INTRAMUSCULAR | Status: DC

## 2014-04-30 MED ORDER — PHENYLEPHRINE 40 MCG/ML (10ML) SYRINGE FOR IV PUSH (FOR BLOOD PRESSURE SUPPORT)
PREFILLED_SYRINGE | INTRAVENOUS | Status: AC
  Filled 2014-04-30: qty 10

## 2014-04-30 MED ORDER — PRENATAL MULTIVITAMIN CH
1.0000 | ORAL_TABLET | Freq: Every day | ORAL | Status: DC
  Administered 2014-05-01 – 2014-05-02 (×2): 1 via ORAL
  Filled 2014-04-30 (×2): qty 1

## 2014-04-30 MED ORDER — ZOLPIDEM TARTRATE 5 MG PO TABS: 5.0000 mg | ORAL_TABLET | Freq: Every evening | ORAL | Status: DC | PRN

## 2014-04-30 MED ORDER — OXYTOCIN BOLUS FROM INFUSION: 500.0000 mL | INTRAVENOUS | Status: DC

## 2014-04-30 MED ORDER — ONDANSETRON HCL 4 MG PO TABS: 4.0000 mg | ORAL_TABLET | ORAL | Status: DC | PRN

## 2014-04-30 MED ORDER — LANOLIN HYDROUS EX OINT: TOPICAL_OINTMENT | CUTANEOUS | Status: DC | PRN

## 2014-04-30 MED ORDER — DIBUCAINE 1 % RE OINT: 1.0000 "application " | TOPICAL_OINTMENT | RECTAL | Status: DC | PRN

## 2014-04-30 MED ORDER — ONDANSETRON HCL 4 MG/2ML IJ SOLN: 4.0000 mg | INTRAMUSCULAR | Status: DC | PRN

## 2014-04-30 MED ORDER — LIDOCAINE HCL (PF) 1 % IJ SOLN
INTRAMUSCULAR | Status: DC | PRN
  Administered 2014-04-30 (×4): 4 mL

## 2014-04-30 MED ORDER — OXYTOCIN 40 UNITS IN LACTATED RINGERS INFUSION - SIMPLE MED
62.5000 mL/h | INTRAVENOUS | Status: DC
  Administered 2014-04-30: 62.5 mL/h via INTRAVENOUS

## 2014-04-30 MED ORDER — LACTATED RINGERS IV SOLN
500.0000 mL | INTRAVENOUS | Status: DC | PRN
  Administered 2014-04-30: 500 mL via INTRAVENOUS

## 2014-04-30 MED ORDER — FENTANYL 2.5 MCG/ML BUPIVACAINE 1/10 % EPIDURAL INFUSION (WH - ANES)
INTRAMUSCULAR | Status: AC
  Administered 2014-04-30: 14 mL/h via EPIDURAL
  Filled 2014-04-30: qty 125

## 2014-04-30 MED ORDER — BENZOCAINE-MENTHOL 20-0.5 % EX AERO
1.0000 "application " | INHALATION_SPRAY | CUTANEOUS | Status: DC | PRN
  Filled 2014-04-30: qty 56

## 2014-04-30 MED ORDER — LACTATED RINGERS IV SOLN
INTRAVENOUS | Status: DC
  Administered 2014-04-30: 125 mL/h via INTRAVENOUS

## 2014-04-30 MED ORDER — LIDOCAINE HCL (PF) 1 % IJ SOLN
30.0000 mL | INTRAMUSCULAR | Status: DC | PRN
  Filled 2014-04-30 (×2): qty 30

## 2014-04-30 MED ORDER — DIPHENHYDRAMINE HCL 25 MG PO CAPS: 25.0000 mg | ORAL_CAPSULE | Freq: Four times a day (QID) | ORAL | Status: DC | PRN

## 2014-04-30 MED ORDER — CITRIC ACID-SODIUM CITRATE 334-500 MG/5ML PO SOLN
30.0000 mL | ORAL | Status: DC | PRN
Start: 2014-04-30 — End: 2014-05-01
  Administered 2014-04-30: 30 mL via ORAL
  Filled 2014-04-30: qty 15

## 2014-04-30 MED ORDER — SENNOSIDES-DOCUSATE SODIUM 8.6-50 MG PO TABS
2.0000 | ORAL_TABLET | ORAL | Status: DC
  Administered 2014-05-01 (×2): 2 via ORAL
  Filled 2014-04-30 (×3): qty 2

## 2014-04-30 MED ORDER — IBUPROFEN 600 MG PO TABS
600.0000 mg | ORAL_TABLET | Freq: Four times a day (QID) | ORAL | Status: DC
  Administered 2014-05-01 – 2014-05-02 (×7): 600 mg via ORAL
  Filled 2014-04-30 (×8): qty 1

## 2014-04-30 NOTE — Progress Notes (Signed)
Care transferred to Woody Sellerhelsea Aima Mcwhirt, RN

## 2014-04-30 NOTE — Anesthesia Procedure Notes (Signed)
Epidural Patient location during procedure: OB Start time: 04/30/2014 2:50 PM  Staffing Performed by: anesthesiologist   Preanesthetic Checklist Completed: patient identified, site marked, surgical consent, pre-op evaluation, timeout performed, IV checked, risks and benefits discussed and monitors and equipment checked  Epidural Patient position: sitting Prep: site prepped and draped and DuraPrep Patient monitoring: continuous pulse ox and blood pressure Approach: midline Location: L3-L4 Injection technique: LOR air  Needle:  Needle type: Tuohy  Needle gauge: 17 G Needle length: 9 cm and 9 Needle insertion depth: 7 cm Catheter type: closed end flexible Catheter size: 19 Gauge Catheter at skin depth: 12 cm Test dose: negative  Assessment Events: blood not aspirated, injection not painful, no injection resistance, negative IV test and no paresthesia  Additional Notes Discussed risk of headache, infection, bleeding, nerve injury and failed or incomplete block.  Patient voices understanding and wishes to proceed.  Epidural placed easily on first pass.  No paresthesia.  Patient tolerated procedure well with no apparent complications.  Jasmine DecemberA. Livi Mcgann, MDReason for block:procedure for pain

## 2014-04-30 NOTE — Anesthesia Preprocedure Evaluation (Signed)
Anesthesia Evaluation  Patient identified by MRN, date of birth, ID band Patient awake    Reviewed: Allergy & Precautions, H&P , NPO status , Patient's Chart, lab work & pertinent test results, reviewed documented beta blocker date and time   History of Anesthesia Complications Negative for: history of anesthetic complications  Airway Mallampati: I TM Distance: >3 FB Neck ROM: full    Dental  (+) Teeth Intact   Pulmonary neg pulmonary ROS,  breath sounds clear to auscultation        Cardiovascular negative cardio ROS  Rhythm:regular Rate:Normal     Neuro/Psych negative neurological ROS  negative psych ROS   GI/Hepatic negative GI ROS, Neg liver ROS,   Endo/Other  Hypothyroidism   Renal/GU negative Renal ROS  negative genitourinary   Musculoskeletal   Abdominal   Peds  Hematology negative hematology ROS (+)   Anesthesia Other Findings   Reproductive/Obstetrics (+) Pregnancy                           Anesthesia Physical Anesthesia Plan  ASA: II  Anesthesia Plan: Epidural   Post-op Pain Management:    Induction:   Airway Management Planned:   Additional Equipment:   Intra-op Plan:   Post-operative Plan:   Informed Consent: I have reviewed the patients History and Physical, chart, labs and discussed the procedure including the risks, benefits and alternatives for the proposed anesthesia with the patient or authorized representative who has indicated his/her understanding and acceptance.     Plan Discussed with:   Anesthesia Plan Comments:         Anesthesia Quick Evaluation

## 2014-04-30 NOTE — MAU Note (Signed)
srom  At 1200, clear fluid, still coming- pad on. 3rd pair of pants.  Small amt of bloody mucous. This morning.  Was 2 cm when last checked. Denies problems with preg.

## 2014-04-30 NOTE — H&P (Signed)
Mackenzie Boyd is a 38 y.o. female presenting at 39.5 wks with ruptured membranes at 7 am today. Contractions started off and on last night but was able to sleep. No bleeding. Has good FMs.   [facility] Dr Mackenzie [facility]. AMA, Hypothyroid (labs each trimester), low vit D, H/o LEEP with 1 term delivery after that. Weight gain 40 lbs.  Prior SVD after long labor, 8"13"   History OB History   Grav Para Term Preterm Abortions TAB SAB Ect Mult Living   3 1 1  1  1   1      Past Medical History  Diagnosis Date  . Hypothyroidism    History reviewed. No pertinent past surgical history. Family History: family history is not on file. Social History:  reports that she has never smoked. She has never used smokeless tobacco. She reports that she does not drink alcohol or use illicit drugs.   Prenatal Transfer Tool  Maternal Diabetes: No Genetic Screening: Normal Informaseq normal, AFP1 normal  Maternal Ultrasounds/Referrals: Normal Fetal Ultrasounds or other Referrals:  None Maternal Substance Abuse:  No Significant Maternal Medications:  Meds include: Syntroid Significant Maternal Lab Results:  Lab values include: Group B Strep negative Other Comments:  None  ROS  Dilation: 3 Effacement (%): 80 Station: -2 Exam by:: dr. Chason Boyd Blood pressure 106/60, pulse 62, temperature 97.8 F (36.6 C), temperature source Oral, resp. rate 18, height 5\' 5"  (1.651 m), weight 213 lb (96.616 kg), last menstrual period 07/26/2013, SpO2 100.00%. A&O x 3, no acute distress. Pleasant HEENT neg, no thyromegaly Lungs CTA bilat CV RRR, A1S2 normal Abdo soft, non tender, non acute Extr no edema/ tenderness Pelvic Dilation: 3, Effacement (%): 80, Station: -2, clear fluid, pelvis adequate FHT  130/ + accels/ no decels/ moderate variability- category I Toco q 3-5 min since epidural given   Exam Physical Exam  Prenatal labs: ABO, Rh: O/Positive/-- (05/07 0000) Antibody: Negative (05/07  0000) Rubella: Immune (05/07 0000) RPR: Nonreactive (05/07 0000)  HBsAg: Negative (05/07 0000)  HIV: Non-reactive (05/07 0000)  GBS: Negative (06/18 0000)  GLucola normal   Assessment/Plan: 38 yo, G3P1011 at 39.5 wks with SROM and labor. Anticipate SVD, EFW 9 lbs. Start low dose pitocin since UCs have spaced out. GBS neg.  Well controlled hypothryoidism and cervix with LEEP/  Mackenzie Boyd R 04/30/2014, 4:27 PM

## 2014-05-01 ENCOUNTER — Encounter (HOSPITAL_COMMUNITY): Payer: Self-pay

## 2014-05-01 LAB — CBC
HCT: 29.4 % — ABNORMAL LOW (ref 36.0–46.0)
HEMOGLOBIN: 10.1 g/dL — AB (ref 12.0–15.0)
MCH: 29.9 pg (ref 26.0–34.0)
MCHC: 34.4 g/dL (ref 30.0–36.0)
MCV: 87 fL (ref 78.0–100.0)
Platelets: 170 10*3/uL (ref 150–400)
RBC: 3.38 MIL/uL — ABNORMAL LOW (ref 3.87–5.11)
RDW: 14.8 % (ref 11.5–15.5)
WBC: 13.8 10*3/uL — ABNORMAL HIGH (ref 4.0–10.5)

## 2014-05-01 MED ORDER — THYROID 81.25 MG PO TABS
81.2500 mg | ORAL_TABLET | Freq: Every day | ORAL | Status: DC
  Administered 2014-05-02: 07:00:00 via ORAL
  Filled 2014-05-01: qty 1

## 2014-05-01 NOTE — Progress Notes (Signed)
Patient ID: Mackenzie RoamJennifer L Boyd, female   DOB: 10/07/1975, 38 y.o.   MRN: [id number] PPD # 1 SVD  S:  Reports feeling well             Tolerating po/ No nausea or vomiting             Bleeding is light             Pain controlled with ibuprofen (OTC)             Up ad lib / ambulatory / voiding without difficulties    Newborn  Information for the patient's newborn:  Mackenzie Boyd, Mackenzie Boyd [[id number]]  female  breast feeding   O:  A & O x 3, in no apparent distress              VS:  Filed Vitals:   04/30/14 2050 04/30/14 2215 05/01/14 0230 05/01/14 0628  BP: 124/82 118/74 119/67 90/52  Pulse: 89 85 79 61  Temp: 99.8 F (37.7 C) 98.8 F (37.1 C) 97.4 F (36.3 C) 97.7 F (36.5 C)  TempSrc: Oral Oral Oral Oral  Resp: 18 18 18 18   Height:      Weight:      SpO2: 98%       LABS:  Recent Labs  04/30/14 1418 05/01/14 0600  WBC 11.4* 13.8*  HGB 12.7 10.1*  HCT 36.7 29.4*  PLT 208 170    Blood type: --/--/O POS (07/27 1418)  Rubella: Immune (05/07 0000)   I&O: I/O last 3 completed shifts: In: -  Out: 300 [Blood:300]             Lungs: Clear and unlabored  Heart: regular rate and rhythm / no murmurs  Abdomen: soft, non-tender, non-distended              Fundus: firm, non-tender, U-1  Perineum: 2nd degree repair healing well, no edema  Lochia: minimal  Extremities: trace edema, no calf pain or tenderness, no Homans    A/P: PPD # 1  38 y.o., [id number]   Principal Problem:    Postpartum care following vaginal delivery (7/27)    Doing well - stable status  Routine post partum orders  Anticipate discharge tomorrow    Raelyn MoraAWSON, Jadwiga Faidley, M, MSN, CNM 05/01/2014, 9:48 AM

## 2014-05-02 MED ORDER — IBUPROFEN 600 MG PO TABS: 600.0000 mg | ORAL_TABLET | Freq: Four times a day (QID) | ORAL | Status: DC

## 2014-05-02 MED ORDER — GLYCERIN (LAXATIVE) 2.1 G RE SUPP
1.0000 | Freq: Once | RECTAL | Status: AC
  Administered 2014-05-02: 10:00:00 via RECTAL
  Filled 2014-05-02: qty 1

## 2014-05-02 MED ORDER — OXYCODONE-ACETAMINOPHEN 5-325 MG PO TABS: 1.0000 | ORAL_TABLET | ORAL | Status: DC | PRN

## 2014-05-02 NOTE — Progress Notes (Signed)
PPD #2- SVD  Subjective:   Reports feeling well Large painful BM this am Tolerating po/ No nausea or vomiting Bleeding is light Pain controlled with Motrin Up ad lib / ambulatory / voiding without problems Newborn: breastfeeding    Objective:   VS: VS:  Filed Vitals:   05/01/14 0628 05/01/14 1009 05/01/14 1920 05/02/14 0614  BP: 90/52 124/72 128/76 92/54  Pulse: 61 82 98 81  Temp: 97.7 F (36.5 C) 98 F (36.7 C) 98.2 F (36.8 C) 97.8 F (36.6 C)  TempSrc: Oral Oral Oral Oral  Resp: 18 18 18 17   Height:      Weight:      SpO2:    100%    LABS:  Recent Labs  04/30/14 1418 05/01/14 0600  WBC 11.4* 13.8*  HGB 12.7 10.1*  PLT 208 170   Blood type: --/--/O POS (07/27 1418) Rubella: Immune (05/07 0000)                I&O: Intake/Output     07/28 0701 - 07/29 0700 07/29 0701 - 07/30 0700   Blood     Total Output       Net              Physical Exam: Alert and oriented X3 Abdomen: soft, non-tender, non-distended  Fundus: firm, non-tender, U-2 Perineum: Well approximated, no significant erythema, edema, or drainage; healing well. Lochia: small Extremities: No edema, no calf pain or tenderness    Assessment: PPD # 2 G3P2012/ S/P:spontaneous vaginal, 2nd degree Mild anemia Doing well - stable for discharge home   Plan: Discharge home RX's:  Ibuprofen 600mg  po Q 6 hrs prn pain #30 Refill x 0 Percocet 5/325 1 to 2 po Q 4 hrs prn pain #30 Refill x 0 Routine pp visit in [facility] [facility] booklet given    Donette LarryBHAMBRI, Darrian Goodwill, N MSN, CNM 05/02/2014, 12:43 PM

## 2014-05-02 NOTE — Anesthesia Postprocedure Evaluation (Signed)
  Anesthesia Post-op Note  Patient: Mackenzie RoamJennifer L Guardiola  Procedure(s) Performed: * No procedures listed *  Patient Location: Mother/Baby  Anesthesia Type:Epidural  Level of Consciousness: awake, oriented and patient cooperative  Airway and Oxygen Therapy: Patient Spontanous Breathing  Post-op Pain: none  Post-op Assessment: Patient's Cardiovascular Status Stable, Respiratory Function Stable, Patent Airway, No signs of Nausea or vomiting, Adequate PO intake, Pain level controlled and No headache  Post-op Vital Signs: Reviewed and stable  Last Vitals:  Filed Vitals:   05/02/14 0614  BP: 92/54  Pulse: 81  Temp: 36.6 C  Resp: 17    Complications: No apparent anesthesia complications

## 2014-05-02 NOTE — Discharge Summary (Signed)
Obstetric Discharge Summary Reason for Admission: spontaneous abortion Prenatal Procedures: AMA, Hypothyroidism, excess weight gain Intrapartum Procedures: spontaneous vaginal delivery Postpartum Procedures: none Complications-Operative and Postpartum: 2nd degree perineal laceration Hemoglobin  Date Value Ref Range Status  05/01/2014 10.1* 12.0 - 15.0 g/dL Final     DELTA CHECK NOTED     REPEATED TO VERIFY     HCT  Date Value Ref Range Status  05/01/2014 29.4* 36.0 - 46.0 % Final    Physical Exam:  General: alert and cooperative Lochia: appropriate Uterine Fundus: firm Incision: healing well, no significant drainage, no dehiscence, no significant erythema DVT Evaluation: No evidence of DVT seen on physical exam. Negative Homan's sign. No cords or calf tenderness. No significant calf/ankle edema.  Discharge Diagnoses: Term Pregnancy-delivered  Discharge Information: Date: 05/02/2014 Activity: pelvic rest Diet: routine Medications: PNV, Ibuprofen and Percocet Condition: stable Instructions: refer to practice specific booklet Discharge to: home Follow-up Information   Follow up with LAVOIE,MARIE-LYNE, MD In 6 weeks.   Specialty:  Obstetrics and Gynecology   Contact information:   [address] Severe[address] [place] [id number] [phone]       Newborn Data: Live born female on 04/30/14 Birth Weight: 8 lb 11 oz (3941 g) APGAR: 9, 9  Home with mother.  Mackenzie Boyd, N 05/02/2014, 8:10 PM

## 2014-05-02 NOTE — Progress Notes (Signed)
Patient complaining of severe constipation and requesting glycerin suppository. Called Dr. Algie CofferFogelman who ordered suppository. Will continue to monitor. Earl Galasborne, Linda HedgesStefanie Horse ShoeHudspeth

## 2014-05-06 ENCOUNTER — Inpatient Hospital Stay (HOSPITAL_COMMUNITY): Admission: RE | Admit: 2014-05-06 | Payer: BC Managed Care – PPO | Source: Ambulatory Visit

## 2014-05-12 NOTE — Discharge Summary (Signed)
Please notes: reason for admission is SROM (not SAB as stated above). Pt w/ live birth delivery.

## 2014-08-06 ENCOUNTER — Encounter (HOSPITAL_COMMUNITY): Payer: Self-pay

## 2015-01-03 ENCOUNTER — Emergency Department (HOSPITAL_COMMUNITY)
Admission: EM | Admit: 2015-01-03 | Discharge: 2015-01-03 | Disposition: A | Discharge status: Home / Self Care | Payer: BLUE CROSS/BLUE SHIELD | Attending: Emergency Medicine | Admitting: Emergency Medicine

## 2015-01-03 ENCOUNTER — Encounter (HOSPITAL_COMMUNITY): Payer: Self-pay

## 2015-01-03 ENCOUNTER — Emergency Department (HOSPITAL_COMMUNITY): Payer: BLUE CROSS/BLUE SHIELD

## 2015-01-03 DIAGNOSIS — R51 Headache: Secondary | ICD-10-CM | POA: Diagnosis present

## 2015-01-03 DIAGNOSIS — Z7982 Long term (current) use of aspirin: Secondary | ICD-10-CM | POA: Diagnosis not present

## 2015-01-03 DIAGNOSIS — E039 Hypothyroidism, unspecified: Secondary | ICD-10-CM | POA: Diagnosis not present

## 2015-01-03 DIAGNOSIS — G4489 Other headache syndrome: Secondary | ICD-10-CM

## 2015-01-03 DIAGNOSIS — R509 Fever, unspecified: Secondary | ICD-10-CM

## 2015-01-03 DIAGNOSIS — Z791 Long term (current) use of non-steroidal anti-inflammatories (NSAID): Secondary | ICD-10-CM | POA: Diagnosis not present

## 2015-01-03 DIAGNOSIS — Z79899 Other long term (current) drug therapy: Secondary | ICD-10-CM | POA: Insufficient documentation

## 2015-01-03 LAB — URINE MICROSCOPIC-ADD ON

## 2015-01-03 LAB — URINALYSIS, ROUTINE W REFLEX MICROSCOPIC
GLUCOSE, UA: NEGATIVE mg/dL
HGB URINE DIPSTICK: NEGATIVE
Ketones, ur: 40 mg/dL — AB
Nitrite: NEGATIVE
Protein, ur: NEGATIVE mg/dL
SPECIFIC GRAVITY, URINE: 1.025 (ref 1.005–1.030)
UROBILINOGEN UA: 1 mg/dL (ref 0.0–1.0)
pH: 6.5 (ref 5.0–8.0)

## 2015-01-03 LAB — CBC WITH DIFFERENTIAL/PLATELET
BASOS ABS: 0 10*3/uL (ref 0.0–0.1)
BASOS PCT: 1 % (ref 0–1)
EOS PCT: 0 % (ref 0–5)
Eosinophils Absolute: 0 10*3/uL (ref 0.0–0.7)
HEMATOCRIT: 40.4 % (ref 36.0–46.0)
Hemoglobin: 13.6 g/dL (ref 12.0–15.0)
Lymphocytes Relative: 19 % (ref 12–46)
Lymphs Abs: 1.1 10*3/uL (ref 0.7–4.0)
MCH: 28.9 pg (ref 26.0–34.0)
MCHC: 33.7 g/dL (ref 30.0–36.0)
MCV: 85.8 fL (ref 78.0–100.0)
Monocytes Absolute: 0.8 10*3/uL (ref 0.1–1.0)
Monocytes Relative: 14 % — ABNORMAL HIGH (ref 3–12)
Neutro Abs: 3.8 10*3/uL (ref 1.7–7.7)
Neutrophils Relative %: 66 % (ref 43–77)
Platelets: 199 10*3/uL (ref 150–400)
RBC: 4.71 MIL/uL (ref 3.87–5.11)
RDW: 13 % (ref 11.5–15.5)
WBC: 5.7 10*3/uL (ref 4.0–10.5)

## 2015-01-03 LAB — BASIC METABOLIC PANEL
ANION GAP: 6 (ref 5–15)
BUN: 11 mg/dL (ref 6–23)
CHLORIDE: 107 mmol/L (ref 96–112)
CO2: 23 mmol/L (ref 19–32)
Calcium: 8.4 mg/dL (ref 8.4–10.5)
Creatinine, Ser: 0.87 mg/dL (ref 0.50–1.10)
GFR calc non Af Amer: 84 mL/min — ABNORMAL LOW (ref >90)
Glucose, Bld: 86 mg/dL (ref 70–99)
Potassium: 3.6 mmol/L (ref 3.5–5.1)
Sodium: 136 mmol/L (ref 135–145)

## 2015-01-03 MED ORDER — KETOROLAC TROMETHAMINE 30 MG/ML IJ SOLN
60.0000 mg | Freq: Once | INTRAMUSCULAR | Status: AC
  Administered 2015-01-03: 60 mg via INTRAMUSCULAR
  Filled 2015-01-03: qty 2

## 2015-01-03 MED ORDER — ONDANSETRON HCL 8 MG PO TABS: 8.0000 mg | ORAL_TABLET | Freq: Three times a day (TID) | ORAL | Status: DC | PRN

## 2015-01-03 NOTE — ED Notes (Signed)
Pt reports taking various OTC medications, as well as percocet without relief. Pt sts "you have to be very careful what you gonna give me for pain since I'm breastfeeding".

## 2015-01-03 NOTE — ED Notes (Signed)
Pt c/o increasing headache, n/v, light sensitivity, and fever x 1 day.  Pain score 9/10.  Pt reports taking a variety of OTC medication w/o relief.  Denies injury.

## 2015-01-03 NOTE — ED Notes (Signed)
Pt was given soda sprite for fluid challenge--- tolerated well.

## 2015-01-03 NOTE — ED Provider Notes (Signed)
CSN: [id number]     Arrival date & time 01/03/15  1322 History   First MD Initiated Contact with Patient 01/03/15 1505     Chief Complaint  Patient presents with  . Headache  . Emesis     (Consider location/radiation/quality/duration/timing/severity/associated sxs/prior Treatment) Patient is a 39 y.o. female presenting with headaches and vomiting. The history is provided by the patient.  Headache Associated symptoms: vomiting   Emesis Associated symptoms: headaches    Mackenzie Boyd is a 39 y.o. female who complains of headache associated with low-grade fever, since yesterday. She has been anorexic and had 2 episodes of vomiting during the night. She did have some nausea but it has resolved. She has not drank or eaten much today. She is currently breast-feeding her 76-month-old child. She reports that her child has a viral infection currently. There are no other known sick exposures. She denies cough, chest pain, neck pain, back pain or problems ambulating. She denies dysuria or urinary frequency. There are no other known modifying factors.   Past Medical History  Diagnosis Date  . Hypothyroidism   . Postpartum care following vaginal delivery (7/26) 05/01/2014  . Postpartum care following vaginal delivery (7/27) 05/01/2014   History reviewed. No pertinent past surgical history. History reviewed. No pertinent family history. History  Substance Use Topics  . Smoking status: Never Smoker   . Smokeless tobacco: Never Used  . Alcohol Use: No   OB History    Gravida Para Term Preterm AB TAB SAB Ectopic Multiple Living   Review of Systems  Gastrointestinal: Positive for vomiting.  Neurological: Positive for headaches.  All other systems reviewed and are negative.     Allergies  Review of patient's allergies indicates no known allergies.  Home Medications   Prior to Admission medications   Medication Sig Start Date End Date Taking? Authorizing Provider   acetaminophen (TYLENOL) 500 MG tablet Take 1,000 mg by mouth every 6 (six) hours as needed for moderate pain (pain).   Yes Historical Provider, MD  aspirin-acetaminophen-caffeine (EXCEDRIN MIGRAINE) [phone] MG per tablet Take 2 tablets by mouth every 6 (six) hours as needed (pain).   Yes Historical Provider, MD  naproxen sodium (ANAPROX) 220 MG tablet Take 440 mg by mouth 2 (two) times daily as needed (headache).   Yes Historical Provider, MD  oxyCODONE-acetaminophen (PERCOCET/ROXICET) 5-325 MG per tablet Take 1-2 tablets by mouth every 4 (four) hours as needed for severe pain (moderate - severe pain). 05/02/14  Yes Lawernce Pitts, CNM  Thyroid (NATURE-THROID) 81.25 MG TABS Take 1 tablet by mouth daily.   Yes Historical Provider, MD  ibuprofen (ADVIL,MOTRIN) 600 MG tablet Take 1 tablet (600 mg total) by mouth every 6 (six) hours. Patient not taking: Reported on 01/03/2015 05/02/14   Lawernce Pitts, CNM   BP 126/74 mmHg  Pulse 90  Temp(Src) 98.1 F (36.7 C) (Oral)  Resp 15  Ht  (1.676 m)  SpO2 96%  LMP 12/16/2014 Physical Exam  Constitutional: She is oriented to person, place, and time. She appears well-developed and well-nourished.  Nontoxic appearance. She is able to move easily on the stretcher and is able to sit up, easily.  HENT:  Head: Normocephalic and atraumatic.  Right Ear: External ear normal.  Left Ear: External ear normal.  Eyes: Conjunctivae and EOM are normal. Pupils are equal, round, and reactive to light.  Neck: Normal range of motion and  phonation normal. Neck supple.  She is able to touch her chin to the chest. There is no meningismus.  Cardiovascular: Normal rate, regular rhythm and normal heart sounds.   Pulmonary/Chest: Effort normal and breath sounds normal. She exhibits no bony tenderness.  Abdominal: Soft. She exhibits no distension. There is no tenderness.  Musculoskeletal: Normal range of motion.  Neurological: She is alert and oriented to person,  place, and time. No cranial nerve deficit or sensory deficit. She exhibits normal muscle tone. Coordination normal.  Skin: Skin is warm, dry and intact.  Psychiatric: She has a normal mood and affect. Her behavior is normal. Judgment and thought content normal.  Nursing note and vitals reviewed.   ED Course  Procedures (including critical care time)  Medications  ketorolac (TORADOL) 30 MG/ML injection 60 mg (60 mg Intramuscular Given 01/03/15 1558)    Patient Vitals for the past 24 hrs:  BP Temp Temp src Pulse Resp SpO2 Height  01/03/15 1348 126/74 mmHg 98.1 F (36.7 C) Oral 90 15 96 % 5\' 6"  (1.676 m)    4:19 PM Reevaluation with update and discussion. After initial assessment and treatment, an updated evaluation reveals she is more comfortable. She is tolerating oral food and fluid. She states that her headache has improved. Sayvon Arterberry L    Labs Review Labs Reviewed  URINE CULTURE  BASIC METABOLIC PANEL  CBC WITH DIFFERENTIAL/PLATELET  URINALYSIS, ROUTINE W REFLEX MICROSCOPIC    Imaging Review Ct Head Wo Contrast  01/03/2015   CLINICAL DATA:  Headache, nausea  EXAM: CT HEAD WITHOUT CONTRAST  TECHNIQUE: Contiguous axial images were obtained from the base of the skull through the vertex without intravenous contrast.  COMPARISON:  None.  FINDINGS: No skull fracture is noted. Paranasal sinuses and mastoid air cells are unremarkable.  No acute cortical infarction. No mass lesion is noted on this unenhanced scan. The gray and white-matter differentiation is preserved. No hydrocephalus. No intra or extra-axial fluid collection.  IMPRESSION: No acute intracranial abnormality.   Electronically Signed   By: Natasha MeadLiviu  Pop M.D.   On: 01/03/2015 16:14     EKG Interpretation None      MDM   Final diagnoses:  Fever, unspecified fever cause  Other headache syndrome    Nonspecific febrile illness, with headache. There is no clinical evidence, for meningismus. Therefore, I doubt that  this represents meningitis. Urinalysis is somewhat abnormal, but she does not have any urinary tract symptoms. She likely has a viral illness, of some type. I doubt serious bacterial infection, metabolic instability or impending vascular collapse.  Nursing Notes Reviewed/ Care Coordinated Applicable Imaging Reviewed Interpretation of Laboratory Data incorporated into ED treatment  The patient appears reasonably screened and/or stabilized for discharge and I doubt any other medical condition or other [facility] requiring further screening, evaluation, or treatment in the ED at this time prior to discharge.  Plan: Home Medications- Tylenol around the clock; Home Treatments- rest, fluids; return here if the recommended treatment, does not improve the symptoms; Recommended follow up- PCP or here prn   Mancel BaleElliott Sharissa Brierley, MD 01/03/15 1753

## 2015-01-03 NOTE — ED Notes (Signed)
Pt asked to undress and put gown on, she refused to take her pants off.

## 2015-01-03 NOTE — Discharge Instructions (Signed)
Take Tylenol every 4 hours, until your symptoms have resolved. Try to eat and drink, regularly. Return here if her symptoms worsen or you have additional problems.   General Headache Without Cause A general headache is pain or discomfort felt around the head or neck area. The cause may not be found.  HOME CARE   Keep all doctor visits.  Only take medicines as told by your doctor.  Lie down in a dark, quiet room when you have a headache.  Keep a journal to find out if certain things bring on headaches. For example, write down:  What you eat and drink.  How much sleep you get.  Any change to your diet or medicines.  Relax by getting a massage or doing other relaxing activities.  Put ice or heat packs on the head and neck area as told by your doctor.  Lessen stress.  Sit up straight. Do not tighten (tense) your muscles.  Quit smoking if you smoke.  Lessen how much alcohol you drink.  Lessen how much caffeine you drink, or stop drinking caffeine.  Eat and sleep on a regular schedule.  Get 7 to 9 hours of sleep, or as told by your doctor.  Keep lights dim if bright lights bother you or make your headaches worse. GET HELP RIGHT AWAY IF:   Your headache becomes really bad.  You have a fever.  You have a stiff neck.  You have trouble seeing.  Your muscles are weak, or you lose muscle control.  You lose your balance or have trouble walking.  You feel like you will pass out (faint), or you pass out.  You have really bad symptoms that are different than your first symptoms.  You have problems with the medicines given to you by your doctor.  Your medicines do not work.  Your headache feels different than the other headaches.  You feel sick to your stomach (nauseous) or throw up (vomit). MAKE SURE YOU:   Understand these instructions.  Will watch your condition.  Will get help right away if you are not doing well or get worse. Document Released: 06/30/2008  Document Revised: 12/14/2011 Document Reviewed: 09/11/2011 [facility] Patient Information 2015 [employer] [place]. This information is not intended to replace advice given to you by your health care provider. Make sure you discuss any questions you have with your health care provider.  General Headache Without Cause A headache is pain or discomfort felt around the head or neck area. The specific cause of a headache may not be found. There are many causes and types of headaches. A few common ones are:  Tension headaches.  Migraine headaches.  Cluster headaches.  Chronic daily headaches. HOME CARE INSTRUCTIONS   Keep all follow-up appointments with your caregiver or any specialist referral.  Only take over-the-counter or prescription medicines for pain or discomfort as directed by your caregiver.  Lie down in a dark, quiet room when you have a headache.  Keep a headache journal to find out what may trigger your migraine headaches. For example, write down:  What you eat and drink.  How much sleep you get.  Any change to your diet or medicines.  Try massage or other relaxation techniques.  Put ice packs or heat on the head and neck. Use these 3 to 4 times per day for 15 to 20 minutes each time, or as needed.  Limit stress.  Sit up straight, and do not tense your muscles.  Quit smoking if you smoke.  Limit alcohol use.  Decrease the amount of caffeine you drink, or stop drinking caffeine.  Eat and sleep on a regular schedule.  Get 7 to 9 hours of sleep, or as recommended by your caregiver.  Keep lights dim if bright lights bother you and make your headaches worse. SEEK MEDICAL CARE IF:   You have problems with the medicines you were prescribed.  Your medicines are not working.  You have a change from the usual headache.  You have nausea or vomiting. SEEK IMMEDIATE MEDICAL CARE IF:   Your headache becomes severe.  You have a fever.  You have a stiff neck.  You  have loss of vision.  You have muscular weakness or loss of muscle control.  You start losing your balance or have trouble walking.  You feel faint or pass out.  You have severe symptoms that are different from your first symptoms. MAKE SURE YOU:   Understand these instructions.  Will watch your condition.  Will get help right away if you are not doing well or get worse. Document Released: 09/21/2005 Document Revised: 12/14/2011 Document Reviewed: 10/07/2011 [facility] Patient Information 2015 [place], [place]. This information is not intended to replace advice given to you by your health care provider. Make sure you discuss any questions you have with your health care provider.

## 2015-01-04 ENCOUNTER — Emergency Department (HOSPITAL_COMMUNITY)
Admission: EM | Admit: 2015-01-04 | Discharge: 2015-01-04 | Disposition: A | Discharge status: Home / Self Care | Payer: BLUE CROSS/BLUE SHIELD | Attending: Emergency Medicine | Admitting: Emergency Medicine

## 2015-01-04 ENCOUNTER — Encounter (HOSPITAL_COMMUNITY): Payer: Self-pay

## 2015-01-04 DIAGNOSIS — Z79899 Other long term (current) drug therapy: Secondary | ICD-10-CM | POA: Insufficient documentation

## 2015-01-04 DIAGNOSIS — Z7982 Long term (current) use of aspirin: Secondary | ICD-10-CM | POA: Insufficient documentation

## 2015-01-04 DIAGNOSIS — R1110 Vomiting, unspecified: Secondary | ICD-10-CM | POA: Insufficient documentation

## 2015-01-04 DIAGNOSIS — Z3202 Encounter for pregnancy test, result negative: Secondary | ICD-10-CM | POA: Insufficient documentation

## 2015-01-04 DIAGNOSIS — R519 Headache, unspecified: Secondary | ICD-10-CM

## 2015-01-04 DIAGNOSIS — R51 Headache: Secondary | ICD-10-CM | POA: Insufficient documentation

## 2015-01-04 DIAGNOSIS — E039 Hypothyroidism, unspecified: Secondary | ICD-10-CM | POA: Insufficient documentation

## 2015-01-04 DIAGNOSIS — R509 Fever, unspecified: Secondary | ICD-10-CM | POA: Insufficient documentation

## 2015-01-04 LAB — POC URINE PREG, ED: Preg Test, Ur: NEGATIVE

## 2015-01-04 MED ORDER — ACETAMINOPHEN 325 MG PO TABS
650.0000 mg | ORAL_TABLET | Freq: Once | ORAL | Status: DC
  Filled 2015-01-04: qty 2

## 2015-01-04 MED ORDER — HYDROMORPHONE HCL 1 MG/ML IJ SOLN
1.0000 mg | Freq: Once | INTRAMUSCULAR | Status: AC
  Administered 2015-01-04: 1 mg via INTRAMUSCULAR
  Filled 2015-01-04: qty 1

## 2015-01-04 MED ORDER — DIPHENHYDRAMINE HCL 25 MG PO CAPS
ORAL_CAPSULE | ORAL | Status: AC
  Filled 2015-01-04: qty 1

## 2015-01-04 MED ORDER — DIPHENHYDRAMINE HCL 50 MG/ML IJ SOLN
25.0000 mg | Freq: Once | INTRAMUSCULAR | Status: DC
  Filled 2015-01-04: qty 1

## 2015-01-04 MED ORDER — OXYCODONE-ACETAMINOPHEN 5-325 MG PO TABS: 1.0000 | ORAL_TABLET | Freq: Three times a day (TID) | ORAL | Status: DC | PRN

## 2015-01-04 MED ORDER — METOCLOPRAMIDE HCL 5 MG/ML IJ SOLN
10.0000 mg | Freq: Once | INTRAMUSCULAR | Status: AC
  Administered 2015-01-04: 10 mg via INTRAMUSCULAR
  Filled 2015-01-04 (×2): qty 2

## 2015-01-04 MED ORDER — OXYCODONE-ACETAMINOPHEN 5-325 MG PO TABS: 1.0000 | ORAL_TABLET | Freq: Once | ORAL | Status: DC

## 2015-01-04 MED ORDER — SODIUM CHLORIDE 0.9 % IV BOLUS (SEPSIS): 1000.0000 mL | INTRAVENOUS | Status: DC

## 2015-01-04 MED ORDER — ACETAMINOPHEN 325 MG PO TABS
650.0000 mg | ORAL_TABLET | Freq: Once | ORAL | Status: AC
  Administered 2015-01-04: 650 mg via ORAL

## 2015-01-04 MED ORDER — DIPHENHYDRAMINE HCL 25 MG PO CAPS
25.0000 mg | ORAL_CAPSULE | Freq: Once | ORAL | Status: AC
  Administered 2015-01-04: 25 mg via ORAL

## 2015-01-04 NOTE — ED Notes (Signed)
Pt stated she did not want an IV, EDP made aware, PO medications to be given

## 2015-01-04 NOTE — ED Notes (Signed)
Pt presents with c/o headache and vomiting. Pt was seen for same yesterday and discharged home but pt reports her symptoms have not gotten any better. Pt reports her symptoms initially started on Wednesday night.

## 2015-01-04 NOTE — ED Provider Notes (Signed)
CSN: [id number]     Arrival date & time 01/04/15  0013 History   First MD Initiated Contact with Patient 01/04/15 0240     Chief Complaint  Patient presents with  . Headache  . Emesis     (Consider location/radiation/quality/duration/timing/severity/associated sxs/prior Treatment) Patient is a 39 y.o. female presenting with headaches and vomiting. The history is provided by the patient.  Headache Pain location:  Frontal (vertex) Quality:  Dull Radiates to:  Does not radiate Severity currently:  8/10 Severity at highest:  10/10 Onset quality:  Gradual Duration:  2 days Timing:  Constant Progression:  Worsening Chronicity:  New Similar to prior headaches: no   Context comment:  Spontaneously Relieved by:  Nothing Worsened by:  Light Ineffective treatments: otc meds, toradol here earlier. Associated symptoms: fever   Associated symptoms: no abdominal pain, no back pain, no congestion, no cough, no diarrhea, no dizziness, no eye pain, no fatigue, no nausea, no neck pain and no vomiting   Emesis Associated symptoms: headaches   Associated symptoms: no abdominal pain and no diarrhea     Past Medical History  Diagnosis Date  . Hypothyroidism   . Postpartum care following vaginal delivery (7/26) 05/01/2014  . Postpartum care following vaginal delivery (7/27) 05/01/2014   History reviewed. No pertinent past surgical history. No family history on file. History  Substance Use Topics  . Smoking status: Never Smoker   . Smokeless tobacco: Never Used  . Alcohol Use: No   OB History    Gravida Para Term Preterm AB TAB SAB Ectopic Multiple Living   3 2 2  1  1   2      Review of Systems  Constitutional: Positive for fever. Negative for fatigue.  HENT: Negative for congestion and drooling.   Eyes: Negative for pain.  Respiratory: Negative for cough and shortness of breath.   Cardiovascular: Negative for chest pain.  Gastrointestinal: Negative for nausea, vomiting, abdominal  pain and diarrhea.  Genitourinary: Negative for dysuria and hematuria.  Musculoskeletal: Negative for back pain, gait problem and neck pain.  Skin: Negative for color change.  Neurological: Positive for headaches. Negative for dizziness.  Hematological: Negative for adenopathy.  Psychiatric/Behavioral: Negative for behavioral problems.  All other systems reviewed and are negative.     Allergies  Review of patient's allergies indicates no known allergies.  Home Medications   Prior to Admission medications   Medication Sig Start Date End Date Taking? Authorizing Provider  acetaminophen (TYLENOL) 500 MG tablet Take 1,000 mg by mouth every 6 (six) hours as needed for moderate pain (pain).   Yes Historical Provider, MD  aspirin 325 MG tablet Take 650 mg by mouth every 4 (four) hours as needed for headache.   Yes Historical Provider, MD  aspirin-acetaminophen-caffeine (EXCEDRIN MIGRAINE) [phone] MG per tablet Take 2 tablets by mouth every 6 (six) hours as needed (pain).   Yes Historical Provider, MD  naproxen sodium (ANAPROX) 220 MG tablet Take 440 mg by mouth 2 (two) times daily as needed (headache).   Yes Historical Provider, MD  oxyCODONE-acetaminophen (PERCOCET/ROXICET) 5-325 MG per tablet Take 1-2 tablets by mouth every 4 (four) hours as needed for severe pain (moderate - severe pain). 05/02/14  Yes Lawernce PittsMelanie N Bhambri, CNM  Thyroid (NATURE-THROID) 81.25 MG TABS Take 1 tablet by mouth daily.   Yes Historical Provider, MD  ibuprofen (ADVIL,MOTRIN) 600 MG tablet Take 1 tablet (600 mg total) by mouth every 6 (six) hours. Patient not taking: Reported on 01/03/2015 05/02/14  Lawernce Pitts, CNM  ondansetron (ZOFRAN) 8 MG tablet Take 1 tablet (8 mg total) by mouth every 8 (eight) hours as needed for nausea or vomiting. 01/03/15   Mancel Bale, MD   BP 105/53 mmHg  Pulse 78  Temp(Src) 99 F (37.2 C) (Oral)  Resp 16  SpO2 98%  LMP 12/16/2014 Physical Exam  Constitutional: She is  oriented to person, place, and time. She appears well-developed and well-nourished.  HENT:  Head: Normocephalic.  Mouth/Throat: Oropharynx is clear and moist. No oropharyngeal exudate.  Eyes: Conjunctivae and EOM are normal. Pupils are equal, round, and reactive to light.  Neck: Normal range of motion. Neck supple.  Normal range of motion of the neck without any pain.  She is able to place her chin on her chest without any pain.  Cardiovascular: Normal rate, regular rhythm, normal heart sounds and intact distal pulses.  Exam reveals no gallop and no friction rub.   No murmur heard. Pulmonary/Chest: Effort normal and breath sounds normal. No respiratory distress. She has no wheezes.  Abdominal: Soft. Bowel sounds are normal. There is no tenderness. There is no rebound and no guarding.  Musculoskeletal: Normal range of motion. She exhibits no edema or tenderness.  Neurological: She is alert and oriented to person, place, and time.  alert, oriented x3 speech: normal in context and clarity memory: intact grossly cranial nerves II-XII: intact motor strength: full proximally and distally no involuntary movements or tremors sensation: intact to light touch diffusely  cerebellar: finger-to-nose and heel-to-shin intact gait: normal forwards and backwards  Skin: Skin is warm and dry.  Psychiatric: She has a normal mood and affect. Her behavior is normal.  Nursing note and vitals reviewed.   ED Course  Procedures (including critical care time) Labs Review Labs Reviewed  POC URINE PREG, ED    Imaging Review Ct Head Wo Contrast  01/03/2015   CLINICAL DATA:  Headache, nausea  EXAM: CT HEAD WITHOUT CONTRAST  TECHNIQUE: Contiguous axial images were obtained from the base of the skull through the vertex without intravenous contrast.  COMPARISON:  None.  FINDINGS: No skull fracture is noted. Paranasal sinuses and mastoid air cells are unremarkable.  No acute cortical infarction. No mass lesion is  noted on this unenhanced scan. The gray and white-matter differentiation is preserved. No hydrocephalus. No intra or extra-axial fluid collection.  IMPRESSION: No acute intracranial abnormality.   Electronically Signed   By: Natasha Mead M.D.   On: 01/03/2015 16:14     EKG Interpretation None      MDM   Final diagnoses:  Headache, unspecified headache type    3:47 AM 39 y.o. female with history of hypothyroidism who presents with ongoing headache and low-grade fever. She was seen here earlier today and had symptomatic improvement with her headache. She had a noncontributory workup including a CT of her head. She notes that the headache came back later and she presents now for ongoing headache currently 8 out of 10 in the frontal and vertex areas. She is afebrile and vital signs are unremarkable on my exam. She has no neck pain or neck rigidity. She has a normal neurologic exam. While I suspect this is a viral syndrome with concurrent headache I did offer a lumbar puncture due to ongoing symptoms although I have a fairly low suspicion for meningitis given her otherwise well appearance and normal neurologic exam. She declined LP. She declined IV fluids and IV medications which I think greatly limits my treatment of  her HA. A headache cocktail was given orally. We'll give her some IM Dilaudid as well and re-evaluate. This was discussed w/ pharmacy as pt is breastfeeding. Pharmacist recommends pump and dump, this was discussed w/ the patient and she understands.   4:29 AM: Patient feeling much better ambulating around the department without difficulty. She would like to go home. I think this is reasonable as she continues to appear well. She requested pain medicine for home. Will give her a few percocet, but recommended pump/dump prior to breast feeding. I have discussed the diagnosis/risks/treatment options with the patient and believe the pt to be eligible for discharge home to follow-up with her pcp as  needed. We also discussed returning to the ED immediately if new or worsening sx occur. We discussed the sx which are most concerning (e.g., worsening HA, intractable fever, increased vomiting, neck stiffness) that necessitate immediate return. Medications administered to the patient during their visit and any new prescriptions provided to the patient are listed below.  Medications given during this visit Medications  metoCLOPramide (REGLAN) injection 10 mg (10 mg Intramuscular Given 01/04/15 0116)  acetaminophen (TYLENOL) tablet 650 mg (650 mg Oral Given 01/04/15 0220)  diphenhydrAMINE (BENADRYL) capsule 25 mg (25 mg Oral Given 01/04/15 0220)  diphenhydrAMINE (BENADRYL) 25 mg capsule (  Duplicate 01/04/15 0229)  HYDROmorphone (DILAUDID) injection 1 mg (1 mg Intramuscular Given 01/04/15 0321)    Discharge Medication List as of 01/04/2015  4:28 AM       Purvis Sheffield, MD 01/05/15 [phone]

## 2015-01-04 NOTE — ED Notes (Signed)
Pt stated she cannot give a urine specimen at this time.

## 2015-01-05 LAB — URINE CULTURE
Colony Count: NO GROWTH
Culture: NO GROWTH
Special Requests: NORMAL

## 2015-01-07 ENCOUNTER — Ambulatory Visit (INDEPENDENT_AMBULATORY_CARE_PROVIDER_SITE_OTHER): Payer: BLUE CROSS/BLUE SHIELD | Admitting: Family Medicine

## 2015-01-07 ENCOUNTER — Encounter: Payer: Self-pay | Admitting: Family Medicine

## 2015-01-07 VITALS — BP 121/80 | HR 93 | Temp 98.1°F | Wt 179.9 lb

## 2015-01-07 DIAGNOSIS — J0140 Acute pansinusitis, unspecified: Secondary | ICD-10-CM

## 2015-01-07 MED ORDER — CETIRIZINE HCL 10 MG PO TABS: 10.0000 mg | ORAL_TABLET | Freq: Every day | ORAL | Status: DC

## 2015-01-07 MED ORDER — AZITHROMYCIN 250 MG PO TABS: ORAL_TABLET | ORAL | Status: DC

## 2015-01-07 MED ORDER — FLUCONAZOLE 150 MG PO TABS: 150.0000 mg | ORAL_TABLET | Freq: Once | ORAL | Status: DC

## 2015-01-07 NOTE — Patient Instructions (Signed)
Thank you for coming in, today!  I think it's worth treating you as if this were a sinus infection. I think a sinus infection on top of something viral is likely. I would like you to take azithromycin for 5 days (two pills on day 1, then one pill a day for 4 days). I also would recommend an antihistamine in case there's a component of allergies. Take cetirizine (Zyrtec) 10 mg once a day for at least a week.  Come back to see Dr. Leveda AnnaHensel as you need. If you're not feeling better in the next week to 10 days, come back to be checked again. Please feel free to call with any questions or concerns at any time, at [phone]. --Dr. Casper HarrisonStreet

## 2015-01-07 NOTE — Progress Notes (Signed)
   Subjective:    Patient ID: Mackenzie Boyd, female    DOB: 10/05/1975, 39 y.o.   MRN: [id number]  HPI: Pt presents to clinic for [facility] clinic for ED f/u and persistent headache. She was seen on 3/31 late - 4/1 early for headache, vomiting, and mild fever; she got toradol and had a CT that was negative and was released; she continued to have headache and vomiting and went back to the ED. She got more medications, Reglan, Benadryl, and Tylenol, which did not help her pain. She got a shot of Dilaudid that did help. She was prescribed Zofran and Percocet; note she is breast feeding and has taken Percocet very sparingly. Since then, she has been taking Advil which has helped some. She is concerned she could have an "undiagnosed sinus infection." She has never had migraines, and her children have been ill. She has had some ear pain / pressure, sinus congestion, poor sleep from headache. She does have congestion, sinus pain, and pain "behind her eyes."  Of note, pt recently delivered and is currently breast feeding.  Review of Systems: As above. She has had no more fevers, nausea, or vomiting. She does endorse on-and-off coryza and sore throat symptoms "throughout March." She has had some sinus drainage, but not frank purulent discharge.     Objective:   Physical Exam BP 121/80 mmHg  Pulse 93  Temp(Src) 98.1 F (36.7 C) (Oral)  Wt 179 lb 14.4 oz (81.602 kg)  LMP 12/16/2014 Gen: adult female in NAD HEENT: [place]/AT, EOMI, PERRLA, TM's clear bilaterally; left TM slightly fuller than right but not frankly injected / swollen  Diffuse sinus tenderness, especially at nasal bridge  Nasal mucosae very red / edematous, congestion audible  Posterior oropharynx inflamed but not frankly edematous, no tonsillar swelling / exudates Neck: supple, minimal lymphadenopathy Cardio: RRR, no murmur appreciated Pulm: CTAB, no wheezes, normal WOB Skin/Ext: ext warm, well-perfused, no LE edema, no rashes     Assessment  & Plan:  39yo female with possible subacute sinus infection (question bacterial superimposed on viral) +/- component of allergies - Rx for azithromycin and Zyrtec; reviewed risks of breast feeding while using antibiotics or antihistamines - recommended return to clinic or presentation to the ED if symptoms progress, worsen, or if new symptoms such as frank chest pain, SOB, of difficulty breathing arise - otherwise recommended supportive care (push fluids, Tylenol for pain / fever) - f/u with PCP Dr. Leveda AnnaHensel, otherwise  Bobbye Mortonhristopher M Kmari Brian, MD PGY-3, [facility] 01/07/2015, 10:08 PM

## 2016-02-07 DIAGNOSIS — L814 Other melanin hyperpigmentation: Secondary | ICD-10-CM | POA: Diagnosis not present

## 2016-02-07 DIAGNOSIS — D1801 Hemangioma of skin and subcutaneous tissue: Secondary | ICD-10-CM | POA: Diagnosis not present

## 2016-02-07 DIAGNOSIS — D225 Melanocytic nevi of trunk: Secondary | ICD-10-CM | POA: Diagnosis not present

## 2016-02-07 DIAGNOSIS — L710 Perioral dermatitis: Secondary | ICD-10-CM | POA: Diagnosis not present

## 2016-06-23 DIAGNOSIS — F4329 Adjustment disorder with other symptoms: Secondary | ICD-10-CM | POA: Diagnosis not present

## 2016-06-29 DIAGNOSIS — F4329 Adjustment disorder with other symptoms: Secondary | ICD-10-CM | POA: Diagnosis not present

## 2016-07-06 DIAGNOSIS — F4329 Adjustment disorder with other symptoms: Secondary | ICD-10-CM | POA: Diagnosis not present

## 2016-08-05 DIAGNOSIS — F4329 Adjustment disorder with other symptoms: Secondary | ICD-10-CM | POA: Diagnosis not present

## 2016-08-17 DIAGNOSIS — F4329 Adjustment disorder with other symptoms: Secondary | ICD-10-CM | POA: Diagnosis not present

## 2016-08-18 DIAGNOSIS — E559 Vitamin D deficiency, unspecified: Secondary | ICD-10-CM | POA: Diagnosis not present

## 2016-08-18 DIAGNOSIS — N943 Premenstrual tension syndrome: Secondary | ICD-10-CM | POA: Diagnosis not present

## 2016-08-18 DIAGNOSIS — R5383 Other fatigue: Secondary | ICD-10-CM | POA: Diagnosis not present

## 2016-08-31 DIAGNOSIS — F4329 Adjustment disorder with other symptoms: Secondary | ICD-10-CM | POA: Diagnosis not present

## 2016-11-18 DIAGNOSIS — H5213 Myopia, bilateral: Secondary | ICD-10-CM | POA: Diagnosis not present

## 2016-12-02 DIAGNOSIS — Z6828 Body mass index (BMI) 28.0-28.9, adult: Secondary | ICD-10-CM | POA: Diagnosis not present

## 2016-12-02 DIAGNOSIS — Z01419 Encounter for gynecological examination (general) (routine) without abnormal findings: Secondary | ICD-10-CM | POA: Diagnosis not present

## 2016-12-02 DIAGNOSIS — E039 Hypothyroidism, unspecified: Secondary | ICD-10-CM | POA: Diagnosis not present

## 2017-02-09 DIAGNOSIS — D225 Melanocytic nevi of trunk: Secondary | ICD-10-CM | POA: Diagnosis not present

## 2017-02-09 DIAGNOSIS — D229 Melanocytic nevi, unspecified: Secondary | ICD-10-CM | POA: Diagnosis not present

## 2017-02-09 DIAGNOSIS — L814 Other melanin hyperpigmentation: Secondary | ICD-10-CM | POA: Diagnosis not present

## 2017-02-09 DIAGNOSIS — D234 Other benign neoplasm of skin of scalp and neck: Secondary | ICD-10-CM | POA: Diagnosis not present

## 2017-02-09 DIAGNOSIS — D1800 Hemangioma unspecified site: Secondary | ICD-10-CM | POA: Diagnosis not present

## 2017-08-30 DIAGNOSIS — L659 Nonscarring hair loss, unspecified: Secondary | ICD-10-CM | POA: Diagnosis not present

## 2017-08-30 DIAGNOSIS — Z23 Encounter for immunization: Secondary | ICD-10-CM | POA: Diagnosis not present

## 2017-08-30 DIAGNOSIS — Z6829 Body mass index (BMI) 29.0-29.9, adult: Secondary | ICD-10-CM | POA: Diagnosis not present

## 2017-08-30 DIAGNOSIS — E663 Overweight: Secondary | ICD-10-CM | POA: Diagnosis not present

## 2017-08-30 DIAGNOSIS — E039 Hypothyroidism, unspecified: Secondary | ICD-10-CM | POA: Diagnosis not present

## 2017-12-17 DIAGNOSIS — H5213 Myopia, bilateral: Secondary | ICD-10-CM | POA: Diagnosis not present

## 2018-01-05 DIAGNOSIS — Z114 Encounter for screening for human immunodeficiency virus [HIV]: Secondary | ICD-10-CM | POA: Diagnosis not present

## 2018-01-05 DIAGNOSIS — Z0000 Encounter for general adult medical examination without abnormal findings: Secondary | ICD-10-CM | POA: Diagnosis not present

## 2018-01-05 DIAGNOSIS — E782 Mixed hyperlipidemia: Secondary | ICD-10-CM | POA: Diagnosis not present

## 2018-01-05 DIAGNOSIS — Z1329 Encounter for screening for other suspected endocrine disorder: Secondary | ICD-10-CM | POA: Diagnosis not present

## 2018-01-07 ENCOUNTER — Other Ambulatory Visit: Payer: Self-pay | Admitting: Family Medicine

## 2018-01-07 DIAGNOSIS — E663 Overweight: Secondary | ICD-10-CM | POA: Diagnosis not present

## 2018-01-07 DIAGNOSIS — N760 Acute vaginitis: Secondary | ICD-10-CM | POA: Diagnosis not present

## 2018-01-07 DIAGNOSIS — E039 Hypothyroidism, unspecified: Secondary | ICD-10-CM | POA: Diagnosis not present

## 2018-01-07 DIAGNOSIS — Z01419 Encounter for gynecological examination (general) (routine) without abnormal findings: Secondary | ICD-10-CM | POA: Diagnosis not present

## 2018-01-07 DIAGNOSIS — Z23 Encounter for immunization: Secondary | ICD-10-CM | POA: Diagnosis not present

## 2018-01-07 DIAGNOSIS — Z6829 Body mass index (BMI) 29.0-29.9, adult: Secondary | ICD-10-CM | POA: Diagnosis not present

## 2018-01-07 DIAGNOSIS — Z118 Encounter for screening for other infectious and parasitic diseases: Secondary | ICD-10-CM | POA: Diagnosis not present

## 2018-01-07 DIAGNOSIS — N63 Unspecified lump in unspecified breast: Secondary | ICD-10-CM

## 2018-01-07 DIAGNOSIS — N814 Uterovaginal prolapse, unspecified: Secondary | ICD-10-CM

## 2018-01-07 DIAGNOSIS — Z0000 Encounter for general adult medical examination without abnormal findings: Secondary | ICD-10-CM | POA: Diagnosis not present

## 2018-01-07 DIAGNOSIS — J309 Allergic rhinitis, unspecified: Secondary | ICD-10-CM | POA: Diagnosis not present

## 2018-01-11 ENCOUNTER — Ambulatory Visit
Admission: RE | Admit: 2018-01-11 | Discharge: 2018-01-11 | Disposition: A | Discharge status: Home / Self Care | Payer: BLUE CROSS/BLUE SHIELD | Source: Ambulatory Visit | Attending: Family Medicine | Admitting: Family Medicine

## 2018-01-11 DIAGNOSIS — N63 Unspecified lump in unspecified breast: Secondary | ICD-10-CM

## 2018-01-11 DIAGNOSIS — R928 Other abnormal and inconclusive findings on diagnostic imaging of breast: Secondary | ICD-10-CM | POA: Diagnosis not present

## 2018-01-11 DIAGNOSIS — R922 Inconclusive mammogram: Secondary | ICD-10-CM | POA: Diagnosis not present

## 2018-01-11 DIAGNOSIS — N6489 Other specified disorders of breast: Secondary | ICD-10-CM | POA: Diagnosis not present

## 2018-01-12 ENCOUNTER — Other Ambulatory Visit: Payer: BLUE CROSS/BLUE SHIELD

## 2018-01-12 ENCOUNTER — Ambulatory Visit
Admission: RE | Admit: 2018-01-12 | Discharge: 2018-01-12 | Disposition: A | Discharge status: Home / Self Care | Payer: BLUE CROSS/BLUE SHIELD | Source: Ambulatory Visit | Attending: Family Medicine | Admitting: Family Medicine

## 2018-01-12 DIAGNOSIS — N814 Uterovaginal prolapse, unspecified: Secondary | ICD-10-CM | POA: Diagnosis not present

## 2018-01-19 DIAGNOSIS — N814 Uterovaginal prolapse, unspecified: Secondary | ICD-10-CM | POA: Diagnosis not present

## 2018-01-19 DIAGNOSIS — Z6829 Body mass index (BMI) 29.0-29.9, adult: Secondary | ICD-10-CM | POA: Diagnosis not present

## 2018-01-19 DIAGNOSIS — N854 Malposition of uterus: Secondary | ICD-10-CM | POA: Diagnosis not present

## 2018-01-19 DIAGNOSIS — N393 Stress incontinence (female) (male): Secondary | ICD-10-CM | POA: Diagnosis not present

## 2018-02-16 DIAGNOSIS — D1800 Hemangioma unspecified site: Secondary | ICD-10-CM | POA: Diagnosis not present

## 2018-02-16 DIAGNOSIS — L814 Other melanin hyperpigmentation: Secondary | ICD-10-CM | POA: Diagnosis not present

## 2018-02-16 DIAGNOSIS — D225 Melanocytic nevi of trunk: Secondary | ICD-10-CM | POA: Diagnosis not present

## 2018-02-16 DIAGNOSIS — Z808 Family history of malignant neoplasm of other organs or systems: Secondary | ICD-10-CM | POA: Diagnosis not present

## 2018-03-21 DIAGNOSIS — E039 Hypothyroidism, unspecified: Secondary | ICD-10-CM | POA: Diagnosis not present

## 2018-04-08 DIAGNOSIS — Z113 Encounter for screening for infections with a predominantly sexual mode of transmission: Secondary | ICD-10-CM | POA: Diagnosis not present

## 2018-04-08 DIAGNOSIS — Z6829 Body mass index (BMI) 29.0-29.9, adult: Secondary | ICD-10-CM | POA: Diagnosis not present

## 2018-04-08 DIAGNOSIS — E039 Hypothyroidism, unspecified: Secondary | ICD-10-CM | POA: Diagnosis not present

## 2018-04-08 DIAGNOSIS — Z01419 Encounter for gynecological examination (general) (routine) without abnormal findings: Secondary | ICD-10-CM | POA: Diagnosis not present

## 2018-04-08 DIAGNOSIS — N814 Uterovaginal prolapse, unspecified: Secondary | ICD-10-CM | POA: Diagnosis not present

## 2018-04-08 DIAGNOSIS — Z118 Encounter for screening for other infectious and parasitic diseases: Secondary | ICD-10-CM | POA: Diagnosis not present

## 2018-04-08 DIAGNOSIS — Z1151 Encounter for screening for human papillomavirus (HPV): Secondary | ICD-10-CM | POA: Diagnosis not present

## 2018-04-08 DIAGNOSIS — E559 Vitamin D deficiency, unspecified: Secondary | ICD-10-CM | POA: Diagnosis not present

## 2018-07-12 DIAGNOSIS — Z23 Encounter for immunization: Secondary | ICD-10-CM | POA: Diagnosis not present

## 2018-07-12 DIAGNOSIS — H66003 Acute suppurative otitis media without spontaneous rupture of ear drum, bilateral: Secondary | ICD-10-CM | POA: Diagnosis not present

## 2018-07-12 DIAGNOSIS — R05 Cough: Secondary | ICD-10-CM | POA: Diagnosis not present

## 2018-07-12 DIAGNOSIS — J019 Acute sinusitis, unspecified: Secondary | ICD-10-CM | POA: Diagnosis not present

## 2018-07-28 DIAGNOSIS — J309 Allergic rhinitis, unspecified: Secondary | ICD-10-CM | POA: Diagnosis not present

## 2018-07-30 DIAGNOSIS — R51 Headache: Secondary | ICD-10-CM | POA: Diagnosis not present

## 2018-07-30 DIAGNOSIS — H21561 Pupillary abnormality, right eye: Secondary | ICD-10-CM | POA: Diagnosis not present

## 2018-07-30 DIAGNOSIS — H5340 Unspecified visual field defects: Secondary | ICD-10-CM | POA: Diagnosis not present

## 2018-07-30 DIAGNOSIS — H53451 Other localized visual field defect, right eye: Secondary | ICD-10-CM | POA: Diagnosis not present

## 2018-07-31 DIAGNOSIS — H21561 Pupillary abnormality, right eye: Secondary | ICD-10-CM | POA: Diagnosis not present

## 2018-08-01 DIAGNOSIS — M2740 Unspecified cyst of jaw: Secondary | ICD-10-CM | POA: Diagnosis not present

## 2018-08-01 DIAGNOSIS — H539 Unspecified visual disturbance: Secondary | ICD-10-CM | POA: Diagnosis not present

## 2018-08-02 DIAGNOSIS — Z139 Encounter for screening, unspecified: Secondary | ICD-10-CM | POA: Diagnosis not present

## 2018-08-02 DIAGNOSIS — H469 Unspecified optic neuritis: Secondary | ICD-10-CM | POA: Diagnosis not present

## 2018-08-02 DIAGNOSIS — H547 Unspecified visual loss: Secondary | ICD-10-CM | POA: Diagnosis not present

## 2018-08-02 DIAGNOSIS — E039 Hypothyroidism, unspecified: Secondary | ICD-10-CM | POA: Diagnosis not present

## 2018-08-02 DIAGNOSIS — J309 Allergic rhinitis, unspecified: Secondary | ICD-10-CM | POA: Diagnosis not present

## 2018-08-02 DIAGNOSIS — E559 Vitamin D deficiency, unspecified: Secondary | ICD-10-CM | POA: Diagnosis not present

## 2018-08-05 DIAGNOSIS — H469 Unspecified optic neuritis: Secondary | ICD-10-CM | POA: Diagnosis not present

## 2018-08-05 DIAGNOSIS — H547 Unspecified visual loss: Secondary | ICD-10-CM | POA: Diagnosis not present

## 2018-08-05 DIAGNOSIS — H5711 Ocular pain, right eye: Secondary | ICD-10-CM | POA: Diagnosis not present

## 2018-08-16 DIAGNOSIS — R768 Other specified abnormal immunological findings in serum: Secondary | ICD-10-CM | POA: Diagnosis not present

## 2018-08-23 DIAGNOSIS — H43821 Vitreomacular adhesion, right eye: Secondary | ICD-10-CM | POA: Diagnosis not present

## 2018-09-30 DIAGNOSIS — Z6829 Body mass index (BMI) 29.0-29.9, adult: Secondary | ICD-10-CM | POA: Diagnosis not present

## 2018-09-30 DIAGNOSIS — J069 Acute upper respiratory infection, unspecified: Secondary | ICD-10-CM | POA: Diagnosis not present

## 2018-12-08 ENCOUNTER — Ambulatory Visit: Payer: Self-pay | Admitting: Obstetrics & Gynecology

## 2018-12-09 ENCOUNTER — Ambulatory Visit: Payer: BLUE CROSS/BLUE SHIELD | Admitting: Obstetrics & Gynecology

## 2018-12-09 ENCOUNTER — Encounter: Payer: Self-pay | Admitting: Obstetrics & Gynecology

## 2018-12-09 VITALS — BP 112/76

## 2018-12-09 DIAGNOSIS — L738 Other specified follicular disorders: Secondary | ICD-10-CM

## 2018-12-09 NOTE — Progress Notes (Signed)
    Mackenzie Boyd 1975-12-23 [id number]        43 y.o.  G3P2A1L2 Married.  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  RP: Recurrent tender pimple on vulva  HPI: Tender pimple on vulva coming and going with occasional drainage of pus, not always at the same spot.  Has one currently on left vulva with a LN felt in the left inguinal area.   OB History  Gravida Para Term Preterm AB Living  3 2 2   1 2   SAB TAB Ectopic Multiple Live Births  1       2    # Outcome Date GA Lbr Len/2nd Weight Sex Delivery Anes PTL Lv  3 Term 04/30/14 [redacted]w[redacted]d 06:31 / 00:16 8 lb 11 oz (3.941 kg) F Vag-Spont EPI  LIV  2 Term 08/27/09 [redacted]w[redacted]d   M Vag-Spont EPI N LIV  1 SAB             Past medical history,surgical history, problem list, medications, allergies, family history and social history were all reviewed and documented in the EPIC chart.   Directed ROS with pertinent positives and negatives documented in the history of present illness/assessment and plan.  Exam:  Vitals:   12/09/18 0836  BP: 112/76   General appearance:  Normal  Left inguinal area with a small tender soft mobile LN   Gynecologic exam: Vulva:  Left Sebaceous gland abscess, white head visible.  Betadine prep.  Drainage with a 22 g needle.  Pus drained.  4 x 4 gauze applied on it.   Assessment/Plan:  43 y.o. [id number]   1. Infected sebaceous gland Left vulvar sebaceous gland abscess with reactive left inguinal lymph node.  Abscess drained easily.  Well-tolerated.  Precautions and recommendations reviewed with patient.  Will do warm soaking as needed.  Other orders - thyroid (ARMOUR) 130 MG tablet; Take 130 mg by mouth daily. - Multiple Vitamin (MULTIVITAMIN) tablet; Take 1 tablet by mouth daily.  Follow-up annual gynecologic exam Feb 09, 2019.  Counseling on above issues and coordination of care more than 50% for 15 minutes.  Genia Del MD, 8:40 AM 12/09/2018

## 2018-12-09 NOTE — Patient Instructions (Signed)
1. Infected sebaceous gland Left vulvar sebaceous gland abscess with reactive left inguinal lymph node.  Abscess drained easily.  Well-tolerated.  Precautions and recommendations reviewed with patient.  Will do warm soaking as needed.  Other orders - thyroid (ARMOUR) 130 MG tablet; Take 130 mg by mouth daily. - Multiple Vitamin (MULTIVITAMIN) tablet; Take 1 tablet by mouth daily.  Follow-up annual gynecologic exam Feb 09, 2019.  Cathaleya, it was a pleasure seeing you today!

## 2018-12-21 ENCOUNTER — Other Ambulatory Visit: Payer: Self-pay | Admitting: Family Medicine

## 2018-12-21 DIAGNOSIS — Z1231 Encounter for screening mammogram for malignant neoplasm of breast: Secondary | ICD-10-CM

## 2019-02-01 ENCOUNTER — Ambulatory Visit: Payer: BLUE CROSS/BLUE SHIELD

## 2019-02-09 ENCOUNTER — Encounter: Payer: Self-pay | Admitting: Obstetrics & Gynecology

## 2019-02-20 ENCOUNTER — Other Ambulatory Visit: Payer: Self-pay

## 2019-02-22 ENCOUNTER — Encounter: Payer: Self-pay | Admitting: Obstetrics & Gynecology

## 2019-02-22 DIAGNOSIS — J309 Allergic rhinitis, unspecified: Secondary | ICD-10-CM | POA: Diagnosis not present

## 2019-02-22 DIAGNOSIS — E559 Vitamin D deficiency, unspecified: Secondary | ICD-10-CM | POA: Diagnosis not present

## 2019-02-22 DIAGNOSIS — E039 Hypothyroidism, unspecified: Secondary | ICD-10-CM | POA: Diagnosis not present

## 2019-02-22 DIAGNOSIS — E663 Overweight: Secondary | ICD-10-CM | POA: Diagnosis not present

## 2019-02-23 DIAGNOSIS — E079 Disorder of thyroid, unspecified: Secondary | ICD-10-CM | POA: Diagnosis not present

## 2019-03-03 ENCOUNTER — Other Ambulatory Visit: Payer: Self-pay

## 2019-03-06 ENCOUNTER — Encounter: Payer: Self-pay | Admitting: Obstetrics & Gynecology

## 2019-03-06 ENCOUNTER — Other Ambulatory Visit: Payer: Self-pay

## 2019-03-06 ENCOUNTER — Ambulatory Visit (INDEPENDENT_AMBULATORY_CARE_PROVIDER_SITE_OTHER): Payer: BLUE CROSS/BLUE SHIELD | Admitting: Obstetrics & Gynecology

## 2019-03-06 VITALS — BP 116/70 | Ht 66.25 in

## 2019-03-06 DIAGNOSIS — Z01419 Encounter for gynecological examination (general) (routine) without abnormal findings: Secondary | ICD-10-CM | POA: Diagnosis not present

## 2019-03-06 DIAGNOSIS — Z1151 Encounter for screening for human papillomavirus (HPV): Secondary | ICD-10-CM | POA: Diagnosis not present

## 2019-03-06 DIAGNOSIS — E663 Overweight: Secondary | ICD-10-CM

## 2019-03-06 DIAGNOSIS — Z9889 Other specified postprocedural states: Secondary | ICD-10-CM

## 2019-03-06 DIAGNOSIS — R87618 Other abnormal cytological findings on specimens from cervix uteri: Secondary | ICD-10-CM | POA: Diagnosis not present

## 2019-03-06 DIAGNOSIS — Z3049 Encounter for surveillance of other contraceptives: Secondary | ICD-10-CM

## 2019-03-06 NOTE — Progress Notes (Signed)
Mackenzie Boyd 29-May-1976 [id number]   History:    43 y.o. 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  RP:  Established patient presenting for annual gyn exam   HPI:  Menses regular normal every month.  No BTB.  No pelvic pain.  Using the Diaphragm/Spermicides.  No pain with IC.  Breasts normal.  Had a Dx of Hypothyroidism on Nature Thyroid, now followed by Endocrino, verifying Thyroid function off of Nature Thyroid.  BMI 28.82.  Exercising regularly.  Healthy nutrition.  May f/u here for Fasting Health Labs.  Past medical history,surgical history, family history and social history were all reviewed and documented in the EPIC chart.  Gynecologic History Patient's last menstrual period was 02/20/2019. Contraception: diaphragm Last Pap: 01/2018. Results were: Negative Last mammogram: 01/2018. Results were: Benign Bone Density: Never Colonoscopy: Never  Obstetric History OB History  Gravida Para Term Preterm AB Living  3 2 2   1 2   SAB TAB Ectopic Multiple Live Births  1       2    # Outcome Date GA Lbr Len/2nd Weight Sex Delivery Anes PTL Lv  3 Term 04/30/14 [redacted]w[redacted]d 06:31 / 00:16 8 lb 11 oz (3.941 kg) F Vag-Spont EPI  LIV  2 Term 08/27/09 [redacted]w[redacted]d   M Vag-Spont EPI N LIV  1 SAB              ROS: A ROS was performed and pertinent positives and negatives are included in the history.  GENERAL: No fevers or chills. HEENT: No change in vision, no earache, sore throat or sinus congestion. NECK: No pain or stiffness. CARDIOVASCULAR: No chest pain or pressure. No palpitations. PULMONARY: No shortness of breath, cough or wheeze. GASTROINTESTINAL: No abdominal pain, nausea, vomiting or diarrhea, melena or bright red blood per rectum. GENITOURINARY: No urinary frequency, urgency, hesitancy or dysuria. MUSCULOSKELETAL: No joint or muscle pain, no back pain, no recent trauma. DERMATOLOGIC: No rash, no itching, no lesions. ENDOCRINE: No polyuria, polydipsia,  no heat or cold intolerance. No recent change in weight. HEMATOLOGICAL: No anemia or easy bruising or bleeding. NEUROLOGIC: No headache, seizures, numbness, tingling or weakness. PSYCHIATRIC: No depression, no loss of interest in normal activity or change in sleep pattern.     Exam:   BP 116/70   Ht 5' 6.25" (1.683 m)   LMP 02/20/2019   BMI 28.82 kg/m   Body mass index is 28.82 kg/m.  General appearance : Well developed well nourished female. No acute distress HEENT: Eyes: no retinal hemorrhage or exudates,  Neck supple, trachea midline, no carotid bruits, no thyroidmegaly Lungs: Clear to auscultation, no rhonchi or wheezes, or rib retractions  Heart: Regular rate and rhythm, no murmurs or gallops Breast:Examined in sitting and supine position were symmetrical in appearance, no palpable masses or tenderness,  no skin retraction, no nipple inversion, no nipple discharge, no skin discoloration, no axillary or supraclavicular lymphadenopathy Abdomen: no palpable masses or tenderness, no rebound or guarding Extremities: no edema or skin discoloration or tenderness  Pelvic: Vulva: Normal             Vagina: No gross lesions or discharge  Cervix: No gross lesions or discharge.  Pap/HPV HR done.  Uterus  AV, normal size, shape and consistency, non-tender and mobile.  Mild Uterine prolapse, long anterior lip of cervix.  Adnexa  Without masses or tenderness  Anus: Normal   Assessment/Plan:  43 y.o. female for annual exam  1. Encounter for routine gynecological examination with Papanicolaou smear of cervix Gynecologic exam with mild uterine prolapse, long anterior lip of cervix.  Pap test with high-risk HPV done today.  Breast exam normal.  Will schedule screening mammogram now. May f/u here for Fasting Health Labs.  2. H/O LEEP Pap/HPV HR done today.  3. Encounter for surveillance of other contraceptive Will continue with Diaphragm/Spermicides.  4. Overweight (BMI 25.0-29.9)  Recommend a lower calorie/carb diet such as [employer] diet.  Difficulty loosing weight may be due to Thyroid dysfunction under management with Endocrinologist.  Aerobic activities 5x per week/weight lifting every 2 days.  Other orders - cholecalciferol (VITAMIN D3) 25 MCG (1000 UT) tablet; Take 1,000 Units by mouth daily.  Genia DelMarie-Lyne Mylisa Brunson MD, 9:23 AM 03/06/2019

## 2019-03-08 LAB — PAP, TP IMAGING W/ HPV RNA, RFLX HPV TYPE 16,18/45: HPV DNA High Risk: NOT DETECTED

## 2019-03-11 ENCOUNTER — Encounter: Payer: Self-pay | Admitting: Obstetrics & Gynecology

## 2019-03-11 NOTE — Patient Instructions (Signed)
1. Encounter for routine gynecological examination with Papanicolaou smear of cervix Gynecologic exam with mild uterine prolapse, long anterior lip of cervix.  Pap test with high-risk HPV done today.  Breast exam normal.  Will schedule screening mammogram now. May f/u here for Fasting Health Labs.  2. H/O LEEP Pap/HPV HR done today.  3. Encounter for surveillance of other contraceptive Will continue with Diaphragm/Spermicides.  4. Overweight (BMI 25.0-29.9) Recommend a lower calorie/carb diet such as [employer].  Difficulty loosing weight may be due to Thyroid dysfunction under management with Endocrinologist.  Aerobic activities 5x per week/weight lifting every 2 days.  Other orders - cholecalciferol (VITAMIN D3) 25 MCG (1000 UT) tablet; Take 1,000 Units by mouth daily.  Mackenzie Boyd, it was a pleasure seeing you today!  Will inform you of your results as soon as they are available.

## 2019-03-15 ENCOUNTER — Other Ambulatory Visit: Payer: Self-pay

## 2019-03-15 ENCOUNTER — Ambulatory Visit
Admission: RE | Admit: 2019-03-15 | Discharge: 2019-03-15 | Disposition: A | Discharge status: Home / Self Care | Payer: BLUE CROSS/BLUE SHIELD | Source: Ambulatory Visit | Attending: Family Medicine | Admitting: Family Medicine

## 2019-03-15 DIAGNOSIS — Z1231 Encounter for screening mammogram for malignant neoplasm of breast: Secondary | ICD-10-CM

## 2019-03-20 DIAGNOSIS — D225 Melanocytic nevi of trunk: Secondary | ICD-10-CM | POA: Diagnosis not present

## 2019-03-20 DIAGNOSIS — Z808 Family history of malignant neoplasm of other organs or systems: Secondary | ICD-10-CM | POA: Diagnosis not present

## 2019-03-20 DIAGNOSIS — L710 Perioral dermatitis: Secondary | ICD-10-CM | POA: Diagnosis not present

## 2019-03-20 DIAGNOSIS — L814 Other melanin hyperpigmentation: Secondary | ICD-10-CM | POA: Diagnosis not present

## 2019-05-22 DIAGNOSIS — F418 Other specified anxiety disorders: Secondary | ICD-10-CM | POA: Diagnosis not present

## 2019-05-22 DIAGNOSIS — H5461 Unqualified visual loss, right eye, normal vision left eye: Secondary | ICD-10-CM | POA: Diagnosis not present

## 2019-05-22 DIAGNOSIS — R768 Other specified abnormal immunological findings in serum: Secondary | ICD-10-CM | POA: Diagnosis not present

## 2019-06-02 DIAGNOSIS — E079 Disorder of thyroid, unspecified: Secondary | ICD-10-CM | POA: Diagnosis not present

## 2019-06-13 DIAGNOSIS — R635 Abnormal weight gain: Secondary | ICD-10-CM | POA: Diagnosis not present

## 2019-06-15 DIAGNOSIS — R635 Abnormal weight gain: Secondary | ICD-10-CM | POA: Diagnosis not present

## 2019-06-15 DIAGNOSIS — Z6830 Body mass index (BMI) 30.0-30.9, adult: Secondary | ICD-10-CM | POA: Diagnosis not present

## 2019-06-15 DIAGNOSIS — E786 Lipoprotein deficiency: Secondary | ICD-10-CM | POA: Diagnosis not present

## 2019-06-20 DIAGNOSIS — Z23 Encounter for immunization: Secondary | ICD-10-CM | POA: Diagnosis not present

## 2019-06-20 DIAGNOSIS — E663 Overweight: Secondary | ICD-10-CM | POA: Diagnosis not present

## 2019-06-26 ENCOUNTER — Other Ambulatory Visit: Payer: Self-pay

## 2019-06-26 DIAGNOSIS — R6889 Other general symptoms and signs: Secondary | ICD-10-CM | POA: Diagnosis not present

## 2019-06-26 DIAGNOSIS — E669 Obesity, unspecified: Secondary | ICD-10-CM | POA: Diagnosis not present

## 2019-06-26 DIAGNOSIS — Z6830 Body mass index (BMI) 30.0-30.9, adult: Secondary | ICD-10-CM | POA: Diagnosis not present

## 2019-06-26 DIAGNOSIS — Z713 Dietary counseling and surveillance: Secondary | ICD-10-CM | POA: Diagnosis not present

## 2019-06-26 DIAGNOSIS — Z20822 Contact with and (suspected) exposure to covid-19: Secondary | ICD-10-CM

## 2019-06-28 LAB — NOVEL CORONAVIRUS, NAA: SARS-CoV-2, NAA: NOT DETECTED

## 2019-07-05 DIAGNOSIS — F4322 Adjustment disorder with anxiety: Secondary | ICD-10-CM | POA: Diagnosis not present

## 2019-07-11 DIAGNOSIS — E039 Hypothyroidism, unspecified: Secondary | ICD-10-CM | POA: Diagnosis not present

## 2019-07-12 DIAGNOSIS — H5213 Myopia, bilateral: Secondary | ICD-10-CM | POA: Diagnosis not present

## 2019-07-13 DIAGNOSIS — Z6829 Body mass index (BMI) 29.0-29.9, adult: Secondary | ICD-10-CM | POA: Diagnosis not present

## 2019-07-13 DIAGNOSIS — R635 Abnormal weight gain: Secondary | ICD-10-CM | POA: Diagnosis not present

## 2019-07-13 DIAGNOSIS — R748 Abnormal levels of other serum enzymes: Secondary | ICD-10-CM | POA: Diagnosis not present

## 2019-07-28 DIAGNOSIS — R635 Abnormal weight gain: Secondary | ICD-10-CM | POA: Diagnosis not present

## 2019-08-08 DIAGNOSIS — Z6828 Body mass index (BMI) 28.0-28.9, adult: Secondary | ICD-10-CM | POA: Diagnosis not present

## 2019-08-08 DIAGNOSIS — R635 Abnormal weight gain: Secondary | ICD-10-CM | POA: Diagnosis not present

## 2019-08-21 ENCOUNTER — Other Ambulatory Visit: Payer: Self-pay

## 2019-08-21 DIAGNOSIS — Z20822 Contact with and (suspected) exposure to covid-19: Secondary | ICD-10-CM

## 2019-08-22 LAB — NOVEL CORONAVIRUS, NAA: SARS-CoV-2, NAA: NOT DETECTED

## 2019-08-28 DIAGNOSIS — E663 Overweight: Secondary | ICD-10-CM | POA: Diagnosis not present

## 2019-08-28 DIAGNOSIS — Z713 Dietary counseling and surveillance: Secondary | ICD-10-CM | POA: Diagnosis not present

## 2019-08-28 DIAGNOSIS — Z6827 Body mass index (BMI) 27.0-27.9, adult: Secondary | ICD-10-CM | POA: Diagnosis not present

## 2019-09-05 DIAGNOSIS — L710 Perioral dermatitis: Secondary | ICD-10-CM | POA: Diagnosis not present

## 2019-09-12 DIAGNOSIS — Z6826 Body mass index (BMI) 26.0-26.9, adult: Secondary | ICD-10-CM | POA: Diagnosis not present

## 2019-09-12 DIAGNOSIS — R748 Abnormal levels of other serum enzymes: Secondary | ICD-10-CM | POA: Diagnosis not present

## 2019-09-12 DIAGNOSIS — E669 Obesity, unspecified: Secondary | ICD-10-CM | POA: Diagnosis not present

## 2019-09-19 DIAGNOSIS — Z79899 Other long term (current) drug therapy: Secondary | ICD-10-CM | POA: Diagnosis not present

## 2019-09-19 DIAGNOSIS — E039 Hypothyroidism, unspecified: Secondary | ICD-10-CM | POA: Diagnosis not present

## 2019-10-25 DIAGNOSIS — L700 Acne vulgaris: Secondary | ICD-10-CM | POA: Diagnosis not present

## 2019-10-25 DIAGNOSIS — L719 Rosacea, unspecified: Secondary | ICD-10-CM | POA: Diagnosis not present

## 2019-10-26 DIAGNOSIS — F4323 Adjustment disorder with mixed anxiety and depressed mood: Secondary | ICD-10-CM | POA: Diagnosis not present

## 2019-10-26 DIAGNOSIS — S46219A Strain of muscle, fascia and tendon of other parts of biceps, unspecified arm, initial encounter: Secondary | ICD-10-CM | POA: Diagnosis not present

## 2019-10-26 DIAGNOSIS — K4090 Unilateral inguinal hernia, without obstruction or gangrene, not specified as recurrent: Secondary | ICD-10-CM | POA: Diagnosis not present

## 2019-10-26 DIAGNOSIS — E7800 Pure hypercholesterolemia, unspecified: Secondary | ICD-10-CM | POA: Diagnosis not present

## 2019-10-30 DIAGNOSIS — Z8249 Family history of ischemic heart disease and other diseases of the circulatory system: Secondary | ICD-10-CM | POA: Diagnosis not present

## 2019-10-30 DIAGNOSIS — Z7189 Other specified counseling: Secondary | ICD-10-CM | POA: Diagnosis not present

## 2019-10-30 DIAGNOSIS — E7800 Pure hypercholesterolemia, unspecified: Secondary | ICD-10-CM | POA: Diagnosis not present

## 2019-10-30 DIAGNOSIS — Z9189 Other specified personal risk factors, not elsewhere classified: Secondary | ICD-10-CM | POA: Diagnosis not present

## 2019-11-07 DIAGNOSIS — M6281 Muscle weakness (generalized): Secondary | ICD-10-CM | POA: Diagnosis not present

## 2019-11-07 DIAGNOSIS — R635 Abnormal weight gain: Secondary | ICD-10-CM | POA: Diagnosis not present

## 2019-11-07 DIAGNOSIS — E786 Lipoprotein deficiency: Secondary | ICD-10-CM | POA: Diagnosis not present

## 2019-11-07 DIAGNOSIS — M25511 Pain in right shoulder: Secondary | ICD-10-CM | POA: Diagnosis not present

## 2019-11-07 DIAGNOSIS — M25611 Stiffness of right shoulder, not elsewhere classified: Secondary | ICD-10-CM | POA: Diagnosis not present

## 2019-11-15 DIAGNOSIS — M6281 Muscle weakness (generalized): Secondary | ICD-10-CM | POA: Diagnosis not present

## 2019-11-15 DIAGNOSIS — M25611 Stiffness of right shoulder, not elsewhere classified: Secondary | ICD-10-CM | POA: Diagnosis not present

## 2019-11-15 DIAGNOSIS — M25511 Pain in right shoulder: Secondary | ICD-10-CM | POA: Diagnosis not present

## 2019-11-20 DIAGNOSIS — M25511 Pain in right shoulder: Secondary | ICD-10-CM | POA: Diagnosis not present

## 2019-11-20 DIAGNOSIS — M25611 Stiffness of right shoulder, not elsewhere classified: Secondary | ICD-10-CM | POA: Diagnosis not present

## 2019-11-20 DIAGNOSIS — M6281 Muscle weakness (generalized): Secondary | ICD-10-CM | POA: Diagnosis not present

## 2019-11-22 DIAGNOSIS — E569 Vitamin deficiency, unspecified: Secondary | ICD-10-CM | POA: Diagnosis not present

## 2019-11-22 DIAGNOSIS — L659 Nonscarring hair loss, unspecified: Secondary | ICD-10-CM | POA: Diagnosis not present

## 2019-11-22 DIAGNOSIS — E039 Hypothyroidism, unspecified: Secondary | ICD-10-CM | POA: Diagnosis not present

## 2019-11-22 DIAGNOSIS — R748 Abnormal levels of other serum enzymes: Secondary | ICD-10-CM | POA: Diagnosis not present

## 2019-11-27 DIAGNOSIS — M25511 Pain in right shoulder: Secondary | ICD-10-CM | POA: Diagnosis not present

## 2019-11-27 DIAGNOSIS — M6281 Muscle weakness (generalized): Secondary | ICD-10-CM | POA: Diagnosis not present

## 2019-11-27 DIAGNOSIS — M25611 Stiffness of right shoulder, not elsewhere classified: Secondary | ICD-10-CM | POA: Diagnosis not present

## 2019-11-30 DIAGNOSIS — M25511 Pain in right shoulder: Secondary | ICD-10-CM | POA: Diagnosis not present

## 2019-11-30 DIAGNOSIS — M6281 Muscle weakness (generalized): Secondary | ICD-10-CM | POA: Diagnosis not present

## 2019-11-30 DIAGNOSIS — M25611 Stiffness of right shoulder, not elsewhere classified: Secondary | ICD-10-CM | POA: Diagnosis not present

## 2019-12-04 DIAGNOSIS — M6281 Muscle weakness (generalized): Secondary | ICD-10-CM | POA: Diagnosis not present

## 2019-12-04 DIAGNOSIS — M25511 Pain in right shoulder: Secondary | ICD-10-CM | POA: Diagnosis not present

## 2019-12-04 DIAGNOSIS — M25611 Stiffness of right shoulder, not elsewhere classified: Secondary | ICD-10-CM | POA: Diagnosis not present

## 2019-12-07 DIAGNOSIS — M25511 Pain in right shoulder: Secondary | ICD-10-CM | POA: Diagnosis not present

## 2019-12-07 DIAGNOSIS — M25611 Stiffness of right shoulder, not elsewhere classified: Secondary | ICD-10-CM | POA: Diagnosis not present

## 2019-12-07 DIAGNOSIS — M6281 Muscle weakness (generalized): Secondary | ICD-10-CM | POA: Diagnosis not present

## 2019-12-11 DIAGNOSIS — M25611 Stiffness of right shoulder, not elsewhere classified: Secondary | ICD-10-CM | POA: Diagnosis not present

## 2019-12-11 DIAGNOSIS — M6281 Muscle weakness (generalized): Secondary | ICD-10-CM | POA: Diagnosis not present

## 2019-12-11 DIAGNOSIS — M25511 Pain in right shoulder: Secondary | ICD-10-CM | POA: Diagnosis not present

## 2019-12-14 DIAGNOSIS — M25511 Pain in right shoulder: Secondary | ICD-10-CM | POA: Diagnosis not present

## 2019-12-14 DIAGNOSIS — M25611 Stiffness of right shoulder, not elsewhere classified: Secondary | ICD-10-CM | POA: Diagnosis not present

## 2019-12-14 DIAGNOSIS — M6281 Muscle weakness (generalized): Secondary | ICD-10-CM | POA: Diagnosis not present

## 2019-12-15 DIAGNOSIS — E079 Disorder of thyroid, unspecified: Secondary | ICD-10-CM | POA: Diagnosis not present

## 2020-01-04 DIAGNOSIS — Z6824 Body mass index (BMI) 24.0-24.9, adult: Secondary | ICD-10-CM | POA: Diagnosis not present

## 2020-01-04 DIAGNOSIS — R748 Abnormal levels of other serum enzymes: Secondary | ICD-10-CM | POA: Diagnosis not present

## 2020-01-04 DIAGNOSIS — E669 Obesity, unspecified: Secondary | ICD-10-CM | POA: Diagnosis not present

## 2020-01-04 DIAGNOSIS — Z8639 Personal history of other endocrine, nutritional and metabolic disease: Secondary | ICD-10-CM | POA: Diagnosis not present

## 2020-03-05 ENCOUNTER — Other Ambulatory Visit: Payer: Self-pay | Admitting: Obstetrics & Gynecology

## 2020-03-05 DIAGNOSIS — Z1231 Encounter for screening mammogram for malignant neoplasm of breast: Secondary | ICD-10-CM

## 2020-03-07 ENCOUNTER — Ambulatory Visit (INDEPENDENT_AMBULATORY_CARE_PROVIDER_SITE_OTHER): Payer: BLUE CROSS/BLUE SHIELD | Admitting: Obstetrics & Gynecology

## 2020-03-07 ENCOUNTER — Other Ambulatory Visit: Payer: Self-pay

## 2020-03-07 ENCOUNTER — Encounter: Payer: Self-pay | Admitting: Obstetrics & Gynecology

## 2020-03-07 VITALS — BP 120/70 | Ht 66.0 in | Wt 151.0 lb

## 2020-03-07 DIAGNOSIS — Z01419 Encounter for gynecological examination (general) (routine) without abnormal findings: Secondary | ICD-10-CM | POA: Diagnosis not present

## 2020-03-07 DIAGNOSIS — Z3049 Encounter for surveillance of other contraceptives: Secondary | ICD-10-CM

## 2020-03-07 NOTE — Progress Notes (Signed)
KRISTIANNA SAPERSTEIN 06-25-1976 [id number]   History:    44 y.o.  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  RP:  Established patient presenting for annual gyn exam   HPI:  Menses regular normal every month.  No BTB.  No pelvic pain.  Using the Diaphragm/Spermicides.  No pain with IC.  Uterine prolapse minimally symptomatic and stable. Breasts normal.  Hypothyroidism followed by Endocrino, on Levothyroxine.  BMI decreased to 24.37. Exercising regularly.  Healthy nutrition. Fasting Health Labs with Fam MD and Endocrino.  Past medical history,surgical history, family history and social history were all reviewed and documented in the EPIC chart.  Gynecologic History Patient's last menstrual period was 02/08/2020.  Obstetric History OB History  Gravida Para Term Preterm AB Living  3 2 2   1 2   SAB TAB Ectopic Multiple Live Births  1       2    # Outcome Date GA Lbr Len/2nd Weight Sex Delivery Anes PTL Lv  3 Term 04/30/14 109w5d 06:31 / 00:16 8 lb 11 oz (3.941 kg) F Vag-Spont EPI  LIV  2 Term 08/27/09 [redacted]w[redacted]d   M Vag-Spont EPI N LIV  1 SAB              ROS: A ROS was performed and pertinent positives and negatives are included in the history.  GENERAL: No fevers or chills. HEENT: No change in vision, no earache, sore throat or sinus congestion. NECK: No pain or stiffness. CARDIOVASCULAR: No chest pain or pressure. No palpitations. PULMONARY: No shortness of breath, cough or wheeze. GASTROINTESTINAL: No abdominal pain, nausea, vomiting or diarrhea, melena or bright red blood per rectum. GENITOURINARY: No urinary frequency, urgency, hesitancy or dysuria. MUSCULOSKELETAL: No joint or muscle pain, no back pain, no recent trauma. DERMATOLOGIC: No rash, no itching, no lesions. ENDOCRINE: No polyuria, polydipsia, no heat or cold intolerance. No recent change in weight. HEMATOLOGICAL: No anemia or easy bruising or bleeding. NEUROLOGIC: No headache, seizures,  numbness, tingling or weakness. PSYCHIATRIC: No depression, no loss of interest in normal activity or change in sleep pattern.     Exam:   BP 120/70   Ht 5\' 6"  (1.676 m)   Wt 151 lb (68.5 kg)   LMP 02/08/2020   BMI 24.37 kg/m   Body mass index is 24.37 kg/m.  General appearance : Well developed well nourished female. No acute distress HEENT: Eyes: no retinal hemorrhage or exudates,  Neck supple, trachea midline, no carotid bruits, no thyroidmegaly Lungs: Clear to auscultation, no rhonchi or wheezes, or rib retractions  Heart: Regular rate and rhythm, no murmurs or gallops Breast:Examined in sitting and supine position were symmetrical in appearance, no palpable masses or tenderness,  no skin retraction, no nipple inversion, no nipple discharge, no skin discoloration, no axillary or supraclavicular lymphadenopathy Abdomen: no palpable masses or tenderness, no rebound or guarding Extremities: no edema or skin discoloration or tenderness  Pelvic: Vulva: Normal             Vagina: No gross lesions or discharge  Cervix: No gross lesions or discharge  Uterus  RV, normal size, shape and consistency, non-tender and mobile  Adnexa  Without masses or tenderness  Anus: Normal   Assessment/Plan:  44 y.o. female for annual exam   1. Well female exam with routine gynecological exam Normal gynecologic exam.  Last Pap test June 2020 was negative with negative high-risk HPV, no indication to repeat this  year.  Breast exam normal.  Screening mammogram in June 2020 was negative.  Much improved body mass index at 24.37 now.  Continue with fitness and healthy nutrition.  Health labs with family physician.  2. Encounter for surveillance of other contraceptive Will continue to use the diaphragm with spermicides for contraception.  Other orders - levothyroxine (SYNTHROID) 75 MCG tablet; Take 75 mcg by mouth daily before breakfast.  Princess Bruins MD, 9:25 AM 03/07/2020

## 2020-03-12 ENCOUNTER — Encounter: Payer: Self-pay | Admitting: Obstetrics & Gynecology

## 2020-03-12 NOTE — Patient Instructions (Signed)
1. Well female exam with routine gynecological exam Normal gynecologic exam.  Last Pap test June 2020 was negative with negative high-risk HPV, no indication to repeat this year.  Breast exam normal.  Screening mammogram in June 2020 was negative.  Much improved body mass index at 24.37 now.  Continue with fitness and healthy nutrition.  Health labs with family physician.  2. Encounter for surveillance of other contraceptive Will continue to use the diaphragm with spermicides for contraception.  Other orders - levothyroxine (SYNTHROID) 75 MCG tablet; Take 75 mcg by mouth daily before breakfast.  Victorino Dike, it was a pleasure seeing you today!

## 2020-03-18 ENCOUNTER — Ambulatory Visit
Admission: RE | Admit: 2020-03-18 | Discharge: 2020-03-18 | Disposition: A | Discharge status: Home / Self Care | Payer: BLUE CROSS/BLUE SHIELD | Source: Ambulatory Visit

## 2020-03-18 ENCOUNTER — Other Ambulatory Visit: Payer: Self-pay

## 2020-03-18 DIAGNOSIS — Z1231 Encounter for screening mammogram for malignant neoplasm of breast: Secondary | ICD-10-CM | POA: Diagnosis not present

## 2020-03-20 ENCOUNTER — Other Ambulatory Visit: Payer: Self-pay | Admitting: Obstetrics & Gynecology

## 2020-03-20 DIAGNOSIS — R928 Other abnormal and inconclusive findings on diagnostic imaging of breast: Secondary | ICD-10-CM

## 2020-03-25 ENCOUNTER — Ambulatory Visit
Admission: RE | Admit: 2020-03-25 | Discharge: 2020-03-25 | Disposition: A | Discharge status: Home / Self Care | Payer: BLUE CROSS/BLUE SHIELD | Source: Ambulatory Visit | Attending: Obstetrics & Gynecology | Admitting: Obstetrics & Gynecology

## 2020-03-25 ENCOUNTER — Other Ambulatory Visit: Payer: Self-pay

## 2020-03-25 ENCOUNTER — Other Ambulatory Visit: Payer: Self-pay | Admitting: Obstetrics & Gynecology

## 2020-03-25 DIAGNOSIS — R928 Other abnormal and inconclusive findings on diagnostic imaging of breast: Secondary | ICD-10-CM

## 2020-03-28 ENCOUNTER — Other Ambulatory Visit: Payer: BLUE CROSS/BLUE SHIELD

## 2020-03-29 ENCOUNTER — Ambulatory Visit
Admission: RE | Admit: 2020-03-29 | Discharge: 2020-03-29 | Disposition: A | Discharge status: Home / Self Care | Payer: BLUE CROSS/BLUE SHIELD | Source: Ambulatory Visit | Attending: Obstetrics & Gynecology | Admitting: Obstetrics & Gynecology

## 2020-03-29 ENCOUNTER — Other Ambulatory Visit: Payer: Self-pay | Admitting: Obstetrics & Gynecology

## 2020-03-29 ENCOUNTER — Other Ambulatory Visit: Payer: Self-pay

## 2020-03-29 DIAGNOSIS — R928 Other abnormal and inconclusive findings on diagnostic imaging of breast: Secondary | ICD-10-CM

## 2020-03-29 DIAGNOSIS — N6315 Unspecified lump in the right breast, overlapping quadrants: Secondary | ICD-10-CM | POA: Diagnosis not present

## 2020-03-29 DIAGNOSIS — D241 Benign neoplasm of right breast: Secondary | ICD-10-CM | POA: Diagnosis not present

## 2020-03-31 ENCOUNTER — Telehealth: Payer: Self-pay | Admitting: Obstetrics and Gynecology

## 2020-03-31 NOTE — Telephone Encounter (Signed)
The patient called, she had a breast biopsy on Friday and is noticing some redness and skin irritation. She has been unable to reach the Radiologist. She denies fevers, doesn't think the redness has been extending throughout the day. She will mark the boarders of the redness with a pen, she will use local heat, neosporin and call in the am.

## 2020-04-02 NOTE — Telephone Encounter (Signed)
Please call patient to check if improved.

## 2020-04-02 NOTE — Telephone Encounter (Signed)
Patient said she is doing much better she did speak with a Nurse at the breast center regarding her breast issue. No more concerns.

## 2020-04-03 ENCOUNTER — Other Ambulatory Visit: Payer: BLUE CROSS/BLUE SHIELD

## 2020-05-06 DIAGNOSIS — R635 Abnormal weight gain: Secondary | ICD-10-CM | POA: Diagnosis not present

## 2020-05-10 DIAGNOSIS — Z8639 Personal history of other endocrine, nutritional and metabolic disease: Secondary | ICD-10-CM | POA: Diagnosis not present

## 2020-08-02 DIAGNOSIS — H5213 Myopia, bilateral: Secondary | ICD-10-CM | POA: Diagnosis not present

## 2020-08-06 DIAGNOSIS — L710 Perioral dermatitis: Secondary | ICD-10-CM | POA: Diagnosis not present

## 2021-03-10 ENCOUNTER — Encounter: Payer: BLUE CROSS/BLUE SHIELD | Admitting: Obstetrics & Gynecology

## 2021-03-21 ENCOUNTER — Ambulatory Visit (INDEPENDENT_AMBULATORY_CARE_PROVIDER_SITE_OTHER): Payer: BLUE CROSS/BLUE SHIELD | Admitting: Obstetrics & Gynecology

## 2021-03-21 ENCOUNTER — Encounter: Payer: Self-pay | Admitting: Obstetrics & Gynecology

## 2021-03-21 ENCOUNTER — Other Ambulatory Visit (HOSPITAL_COMMUNITY)
Admission: RE | Admit: 2021-03-21 | Discharge: 2021-03-21 | Disposition: A | Discharge status: Home / Self Care | Payer: Self-pay | Source: Ambulatory Visit | Attending: Obstetrics & Gynecology | Admitting: Obstetrics & Gynecology

## 2021-03-21 ENCOUNTER — Other Ambulatory Visit: Payer: Self-pay

## 2021-03-21 VITALS — BP 118/74 | Ht 66.0 in | Wt 152.0 lb

## 2021-03-21 DIAGNOSIS — N63 Unspecified lump in unspecified breast: Secondary | ICD-10-CM

## 2021-03-21 DIAGNOSIS — Z01419 Encounter for gynecological examination (general) (routine) without abnormal findings: Secondary | ICD-10-CM | POA: Diagnosis not present

## 2021-03-21 DIAGNOSIS — Z3049 Encounter for surveillance of other contraceptives: Secondary | ICD-10-CM | POA: Diagnosis not present

## 2021-03-21 DIAGNOSIS — N884 Hypertrophic elongation of cervix uteri: Secondary | ICD-10-CM

## 2021-03-21 NOTE — Progress Notes (Signed)
Mackenzie Boyd 12/07/1975 [id number]   History:    45 y.o. 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   RP:  Established patient presenting for annual gyn exam   HPI:  Menses regular normal every month.  No BTB.  No pelvic pain.  Using the Diaphragm/Spermicides.  No pain with IC.  Uterine prolapse minimally symptomatic and stable. Breast Lumpy bilaterally.  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  Hypothyroidism followed by Endocrino, on Levothyroxine.  BMI stable 24.53.  Exercising regularly.  Healthy nutrition. Fasting Health Labs with Fam MD and Endocrino.   Past medical history,surgical history, family history and social history were all reviewed and documented in the EPIC chart.  Gynecologic History Patient's last menstrual period was 03/08/2021.  Obstetric History OB History  Gravida Para Term Preterm AB Living  _0 SAB IAB Ectopic Multiple Live Births  1       2    # Outcome Date GA Lbr Len/2nd Weight Sex Delivery Anes PTL Lv  3 Term 04/30/14 39w5d06:31 / 00:16 8 lb 11 oz (3.941 kg) F Vag-Spont EPI  LIV  2 Term 08/27/09 441w0d M Vag-Spont EPI N LIV  1 SAB              ROS: A ROS was performed and pertinent positives and negatives are included in the history.  GENERAL: No fevers or chills. HEENT: No change in vision, no earache, sore throat or sinus congestion. NECK: No pain or stiffness. CARDIOVASCULAR: No chest pain or pressure. No palpitations. PULMONARY: No shortness of breath, cough or wheeze. GASTROINTESTINAL: No abdominal pain, nausea, vomiting or diarrhea, melena or bright red blood per rectum. GENITOURINARY: No urinary frequency, urgency, hesitancy or dysuria. MUSCULOSKELETAL: No joint or muscle pain, no back pain, no recent trauma. DERMATOLOGIC: No rash, no itching, no lesions. ENDOCRINE: No polyuria, polydipsia, no heat or cold intolerance. No recent change in weight. HEMATOLOGICAL: No anemia or easy bruising or  bleeding. NEUROLOGIC: No headache, seizures, numbness, tingling or weakness. PSYCHIATRIC: No depression, no loss of interest in normal activity or change in sleep pattern.     Exam:   BP 118/74   Ht _1  (1.676 m)   Wt 152 lb (68.9 kg)   LMP 03/08/2021   BMI 24.53 kg/m   Body mass index is 24.53 kg/m.  General appearance : Well developed well nourished female. No acute distress HEENT: Eyes: no retinal hemorrhage or exudates,  Neck supple, trachea midline, no carotid bruits, no thyroidmegaly Lungs: Clear to auscultation, no rhonchi or wheezes, or rib retractions  Heart: Regular rate and rhythm, no murmurs or gallops Breast:Examined in sitting and supine position were symmetrical in appearance.  Rt breast density 3 cm at 11-12 O'Clock.  Lt breast density 2 cm at 1-2 O'Clock.   No skin retraction, no nipple inversion, no nipple discharge, no skin discoloration, no axillary or supraclavicular lymphadenopathy Abdomen: no palpable masses or tenderness, no rebound or guarding Extremities: no edema or skin discoloration or tenderness  Pelvic: Vulva: Normal             Vagina: No gross lesions or discharge  Cervix: No gross lesions or discharge.  Pap reflex done.  Uterus  AV,  normal size, shape and consistency, non-tender and mobile  Adnexa  Without masses or tenderness  Anus: Normal   Assessment/Plan:  [age].o. female for annual exam   1.  Encounter for routine gynecological examination with Papanicolaou smear of cervix Normal gynecologic exam with elongated stable cervix.  Pap reflex done.  Breast exam with densities.  F/U Fasting Health labs.  Hypothyroidism followed by Endocrino.  Good BMI at 24.53.  - Cytology - PAP( [place]) - CBC; Future - Comp Met (CMET); Future - Lipid Profile; Future - Vitamin D 1,25 dihydroxy; Future  2. Encounter for surveillance of other contraceptive Using condoms  3. Lump or mass in breast Rt breast density 3 cm at 11-12 O'Clock.  Lt breast  density 2 cm at 1-2 O'Clock.  Bilateral Dx mammo/US.  4. Elongation, cervix, hypertrophic  Minimal Sxs.  Doing kegels.  Avoiding pelvic floor pressure.  Princess Bruins MD, 3:15 PM 03/21/2021

## 2021-03-24 ENCOUNTER — Telehealth: Payer: Self-pay | Admitting: *Deleted

## 2021-03-24 ENCOUNTER — Ambulatory Visit: Payer: BLUE CROSS/BLUE SHIELD | Admitting: Obstetrics & Gynecology

## 2021-03-24 DIAGNOSIS — N63 Unspecified lump in unspecified breast: Secondary | ICD-10-CM

## 2021-03-24 NOTE — Telephone Encounter (Signed)
-----   Message from Genia Del, MD sent at 03/21/2021  3:36 PM EDT ----- Regarding: Bilateral Dx mammo/US Rt density at 11-12 O'Clock 3 cm.  Lt density at 1-2 O'Clock 2 cm.

## 2021-03-24 NOTE — Telephone Encounter (Signed)
Patient called in triage voice mail. Tried to call and schedule appt this morning but needs order for diagnostic mammo.

## 2021-03-25 LAB — CYTOLOGY - PAP: Diagnosis: NEGATIVE

## 2021-03-25 NOTE — Telephone Encounter (Signed)
Patient scheduled on 04/15/21 at the breast center for the below.

## 2021-04-15 ENCOUNTER — Ambulatory Visit
Admission: RE | Admit: 2021-04-15 | Discharge: 2021-04-15 | Disposition: A | Discharge status: Home / Self Care | Payer: BLUE CROSS/BLUE SHIELD | Source: Ambulatory Visit | Attending: Obstetrics & Gynecology | Admitting: Obstetrics & Gynecology

## 2021-04-15 ENCOUNTER — Other Ambulatory Visit: Payer: Self-pay

## 2021-04-15 DIAGNOSIS — N63 Unspecified lump in unspecified breast: Secondary | ICD-10-CM

## 2021-08-14 IMAGING — MG MM DIGITAL DIAGNOSTIC UNILAT*R* W/ TOMO W/ CAD
5 of 8 series · 5 of 24 positions shown · non-contrast
Comparison: Previous exam(s).
COMPARISON: Previous exam(s).

Addendum:
CLINICAL DATA: Recall from screening mammography with
tomosynthesis, 2 possible asymmetries in the LEFT breast. A possible
asymmetry in the slight UPPER breast at POSTERIOR depth was visible
only on the MLO images and a possible asymmetry in the INNER breast
at MIDDLE depth was visible only on the CC images.

EXAM:
DIGITAL DIAGNOSTIC LEFT MAMMOGRAM WITH CAD AND TOMO
ULTRASOUND LEFT BREAST

[R XCCL synth-2D]
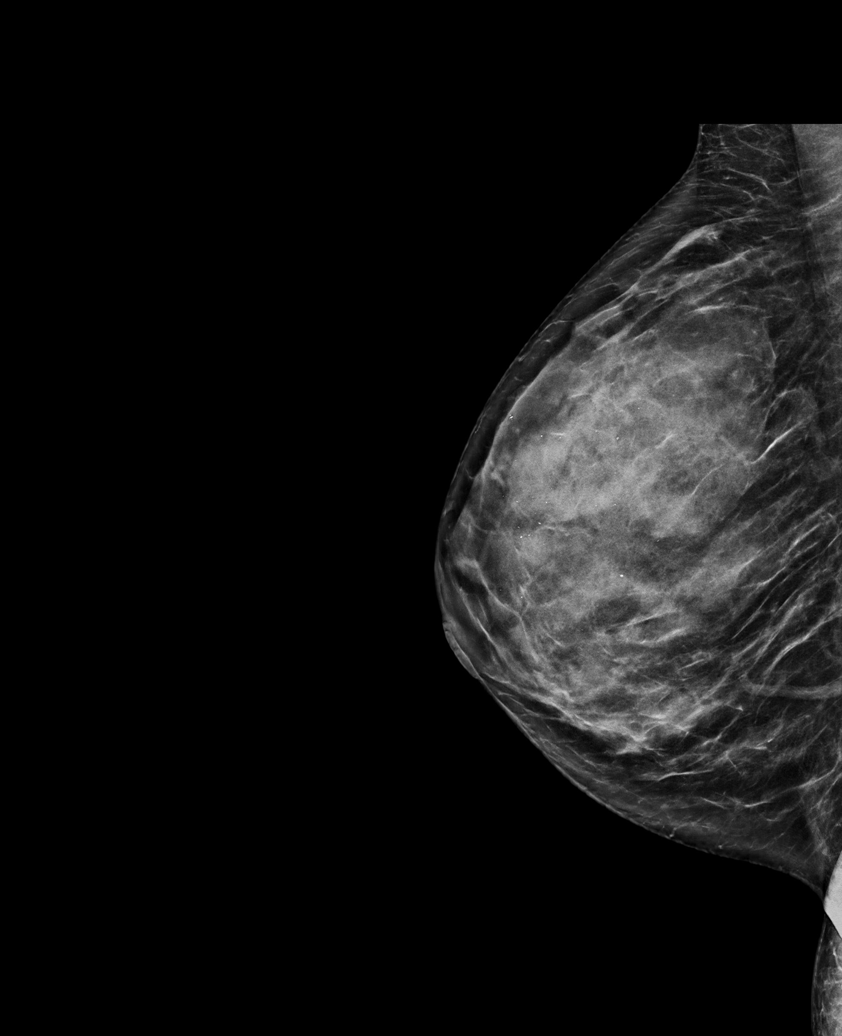

[R CC synth-2D (1 of 2)]
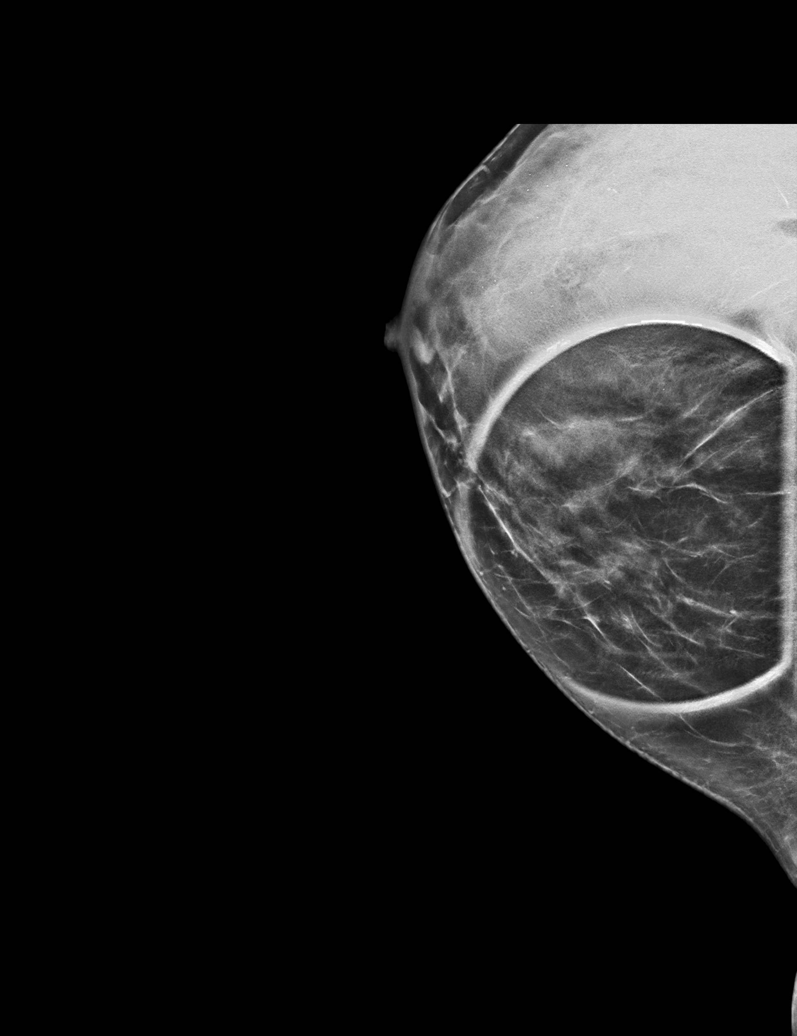

[R MLO synth-2D]
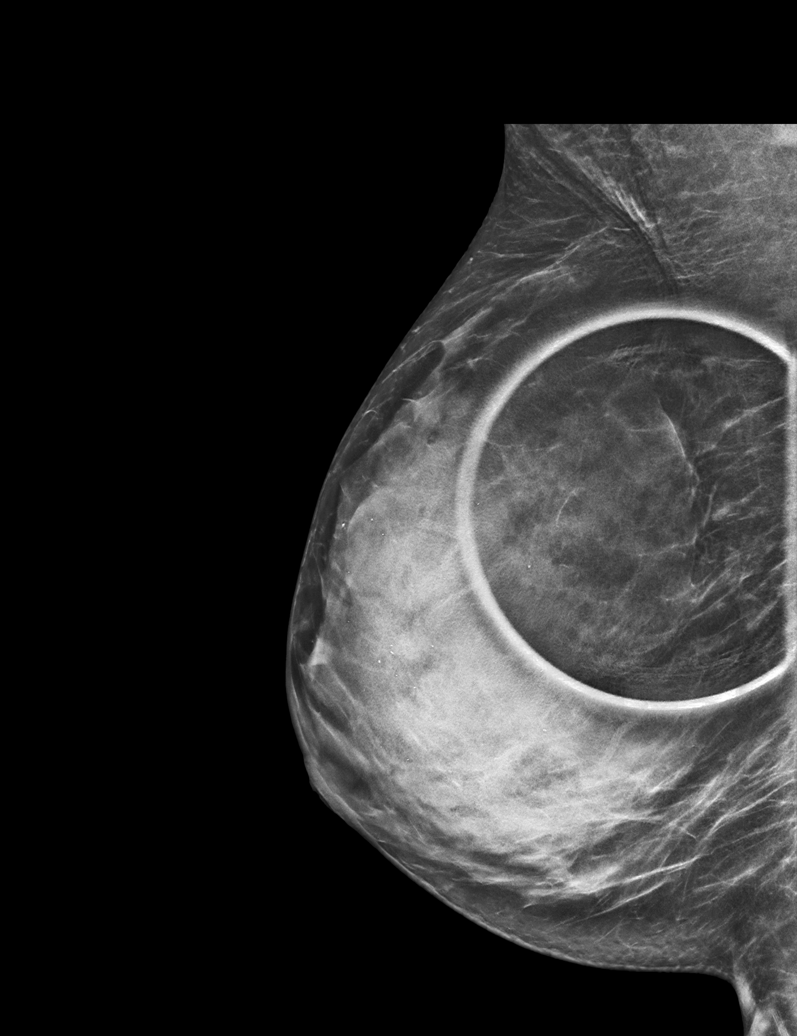

[R CC synth-2D (2 of 2)]
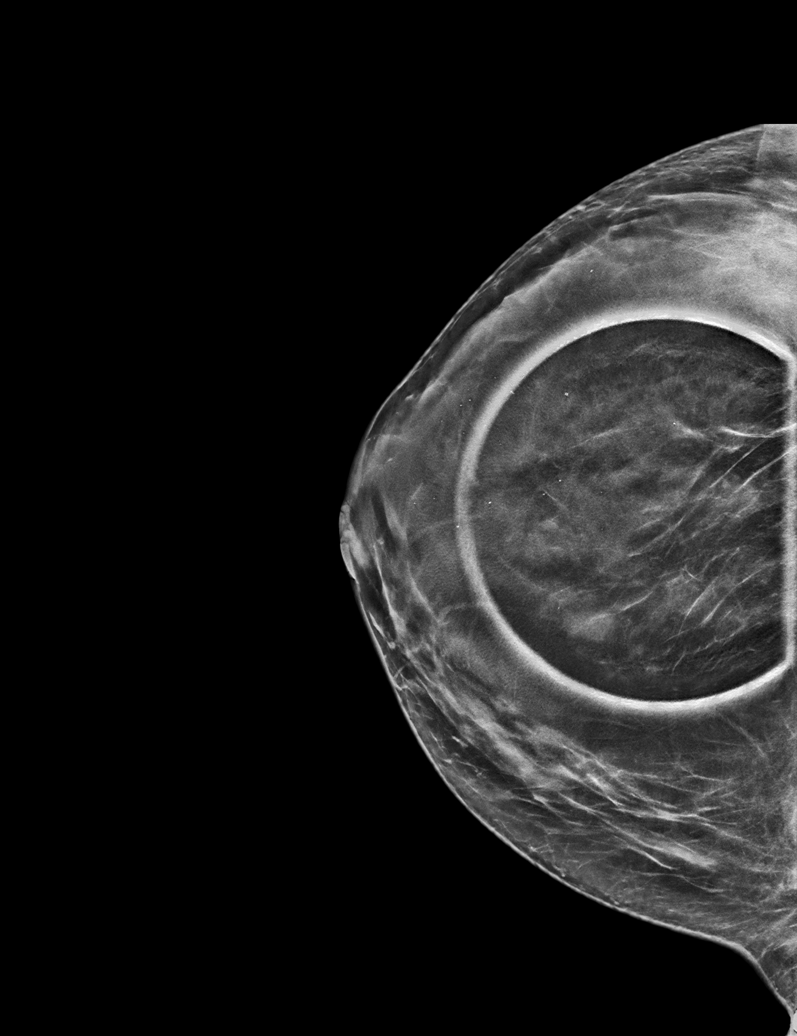

[R XCCL tomo · tomo slice 35/69.0]
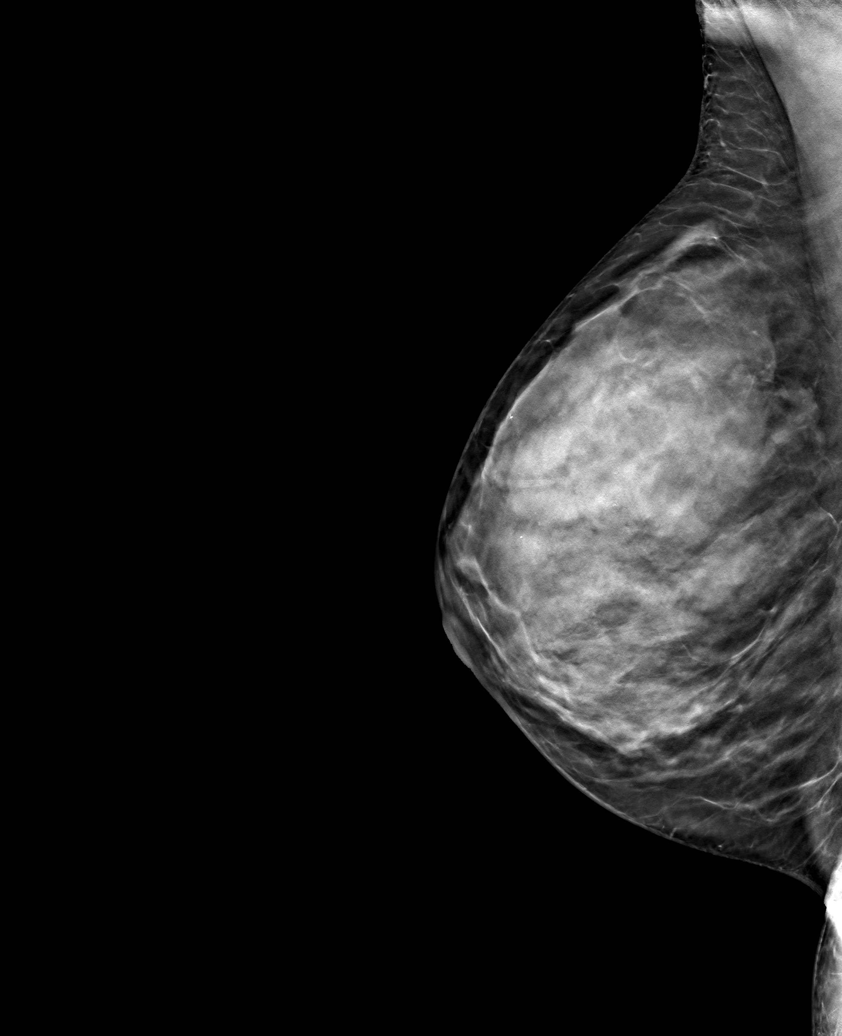

[5 of 24 positions shown; findings below may reference images not displayed]

ACR Breast Density Category d: The breast tissue is extremely dense,
which lowers the sensitivity of mammography.
FINDINGS: Tomosynthesis and synthesized spot-compression MLO view of a
possible asymmetry in the slightly UPPER RIGHT breast at far
POSTERIOR depth, tomosynthesis and synthesized spot-compression CC
view of a possible asymmetry in the INNER RIGHT breast at MIDDLE
depth, and a tomosynthesis and synthesized full field laterally
exaggerated CC view of the RIGHT breast were obtained.

The asymmetry in the slight UPPER breast at POSTERIOR depth
disperses with compression and there is no underlying mass or
architectural distortion. The asymmetry localizes to the near
retroareolar location on the laterally exaggerated CC image,
indicating its location is likely at or near 12 o'clock.

The asymmetry in the INNER breast at MIDDLE depth disperses with
compression. However, on the laterally exaggerated CC images there
is a predominantly obscured mass whose visible margins are
circumscribed, in the slight INNER retroareolar location at MIDDLE
depth. The predominantly obscured mass localizes to the LOWER breast
on the screening MLO images.

The full field mediolateral image was processed with CAD.

Targeted RIGHT breast ultrasound is performed, showing normal dense
fibroglandular tissue throughout the UPPER breast. At the 12 o'clock
position approximately 4 cm from nipple at ANTERIOR depth is an oval
circumscribed parallel hypoechoic mass measuring approximately 0.8 x
0.5 x 0.8 cm, demonstrating no posterior characteristics and no
internal power Doppler flow. Adjacent to this mass at the 12 o'clock
position approximately 2 cm from nipple is a benign simple cyst
measuring 0.5 cm.

At the 6 o'clock position approximately 3 cm from nipple at MIDDLE
depth is an oval parallel circumscribed mass, possibly containing
calcifications, measuring approximately 1.1 x 0.7 x 0.8 cm,
demonstrating no posterior characteristics and demonstrating minimal
peripheral internal power Doppler flow, corresponding to the
mammographic finding.

Sonographic evaluation of the RIGHT axilla demonstrates no
pathologic lymphadenopathy.
IMPRESSION: 1. Likely benign masses in the RIGHT breast at the 12 o'clock
position approximately 4 cm from the nipple at ANTERIOR depth and at
the 6 o'clock position approximately 3 cm from the nipple at MIDDLE
depth. These may represent fibroadenomas.
2. The asymmetries in the POSTERIOR breast and in the INNER breast
questioned on screening mammography correspond to normal
fibroglandular tissue.

RECOMMENDATION:
The option of short-term interval follow-up of the RIGHT breast
masses or ultrasound-guided core needle biopsy was discussed with
the patient. She wishes to proceed with core needle biopsy of both
masses, and the procedure has been scheduled at her convenience.

I have discussed the findings and recommendations with the patient.
If applicable, a reminder letter will be sent to the patient
regarding the next appointment.

BI-RADS CATEGORY  3: Probably benign.

ADDENDUM:
In the clinical data and the title of the report, I inadvertently
stated LEFT breast. The examination was in fact a DIGITAL DIAGNOSTIC
RIGHT MAMMOGRAM WITH CAD AND TOMO and ULTRASOUND RIGHT BREAST. The
remainder of the report correctly states RIGHT breast.

*** End of Addendum ***
ACR Breast Density Category d: The breast tissue is extremely dense,
which lowers the sensitivity of mammography.
FINDINGS: Tomosynthesis and synthesized spot-compression MLO view of a
possible asymmetry in the slightly UPPER RIGHT breast at far
POSTERIOR depth, tomosynthesis and synthesized spot-compression CC
view of a possible asymmetry in the INNER RIGHT breast at MIDDLE
depth, and a tomosynthesis and synthesized full field laterally
exaggerated CC view of the RIGHT breast were obtained.

The asymmetry in the slight UPPER breast at POSTERIOR depth
disperses with compression and there is no underlying mass or
architectural distortion. The asymmetry localizes to the near
retroareolar location on the laterally exaggerated CC image,
indicating its location is likely at or near 12 o'clock.

The asymmetry in the INNER breast at MIDDLE depth disperses with
compression. However, on the laterally exaggerated CC images there
is a predominantly obscured mass whose visible margins are
circumscribed, in the slight INNER retroareolar location at MIDDLE
depth. The predominantly obscured mass localizes to the LOWER breast
on the screening MLO images.

The full field mediolateral image was processed with CAD.

Targeted RIGHT breast ultrasound is performed, showing normal dense
fibroglandular tissue throughout the UPPER breast. At the 12 o'clock
position approximately 4 cm from nipple at ANTERIOR depth is an oval
circumscribed parallel hypoechoic mass measuring approximately 0.8 x
0.5 x 0.8 cm, demonstrating no posterior characteristics and no
internal power Doppler flow. Adjacent to this mass at the 12 o'clock
position approximately 2 cm from nipple is a benign simple cyst
measuring 0.5 cm.

At the 6 o'clock position approximately 3 cm from nipple at MIDDLE
depth is an oval parallel circumscribed mass, possibly containing
calcifications, measuring approximately 1.1 x 0.7 x 0.8 cm,
demonstrating no posterior characteristics and demonstrating minimal
peripheral internal power Doppler flow, corresponding to the
mammographic finding.

Sonographic evaluation of the RIGHT axilla demonstrates no
pathologic lymphadenopathy.
IMPRESSION: 1. Likely benign masses in the RIGHT breast at the 12 o'clock
position approximately 4 cm from the nipple at ANTERIOR depth and at
the 6 o'clock position approximately 3 cm from the nipple at MIDDLE
depth. These may represent fibroadenomas.
2. The asymmetries in the POSTERIOR breast and in the INNER breast
questioned on screening mammography correspond to normal
fibroglandular tissue.

RECOMMENDATION:
The option of short-term interval follow-up of the RIGHT breast
masses or ultrasound-guided core needle biopsy was discussed with
the patient. She wishes to proceed with core needle biopsy of both
masses, and the procedure has been scheduled at her convenience.

I have discussed the findings and recommendations with the patient.
If applicable, a reminder letter will be sent to the patient
regarding the next appointment.

BI-RADS CATEGORY  3: Probably benign.

## 2022-04-02 ENCOUNTER — Ambulatory Visit: Payer: BLUE CROSS/BLUE SHIELD | Admitting: Obstetrics & Gynecology

## 2022-04-30 ENCOUNTER — Encounter: Payer: Self-pay | Admitting: Obstetrics & Gynecology

## 2022-05-05 ENCOUNTER — Encounter: Payer: Self-pay | Admitting: Obstetrics & Gynecology

## 2022-05-05 NOTE — Progress Notes (Unsigned)
[facility] [address] [place] [address] Phone: [phone] Subjective:   [name], am serving as a scribe for Dr. Antoine Primas.  I'm seeing this patient by the request  of:  Genia Del, MD  CC: Right arm pain   [id number]  M[facility]enzie [facility] is a 46 y.o. female coming in with complaint of R arm pain. Patient suffered a FOOSH injury on Thursday. Patient states that she is unable to fully extend elbow. Pain has moved from shoulder to bicep to forearm. Ecchymosis over olecranon and lateral epi.      Past Medical History:  Diagnosis Date   Hypothyroidism    Postpartum care following vaginal delivery (7/26) 05/01/2014   Postpartum care following vaginal delivery (7/27) 05/01/2014   No past surgical history on file. Social History   Socioeconomic History   Marital status: Married    Spouse name: Not on file   Number of children: Not on file   Years of education: Not on file   Highest education level: Not on file  Occupational History   Not on file  Tobacco Use   Smoking status: Never   Smokeless tobacco: Never  Vaping Use   Vaping Use: Never used  Substance and Sexual Activity   Alcohol use: Yes    Comment: Rare   Drug use: No   Sexual activity: Yes    Partners: Male    Birth control/protection: Diaphragm, Rhythm    Comment: 1st 1st intercourse- 80, partners- 3, married- 12 yrs  Other Topics Concern   Not on file  Social History Narrative   Not on file   Social Determinants of Health   Financial Resource Strain: Not on file  Food Insecurity: Not on file  Transportation Needs: Not on file  Physical Activity: Not on file  Stress: Not on file  Social Connections: Not on file   No Known Allergies Family History  Problem Relation Age of Onset   Cancer Maternal Grandmother        brain   Cancer Maternal Grandfather        colon   Breast cancer Paternal Grandmother     Current Outpatient Medications  (Endocrine & Metabolic):    levothyroxine (SYNTHROID) 75 MCG tablet, Take 75 mcg by mouth daily before breakfast.        Reviewed prior external information including notes and imaging from  primary care provider As well as notes that were available from care everywhere and other healthcare systems.  Past medical history, social, surgical and family history all reviewed in electronic medical record.  No pertanent information unless stated regarding to the chief complaint.   Review of Systems:  No headache, visual changes, nausea, vomiting, diarrhea, constipation, dizziness, abdominal pain, skin rash, fevers, chills, night sweats, weight loss, swollen lymph nodes, body aches, joint swelling, chest pain, shortness of breath, mood changes. POSITIVE muscle aches  Objective  unknown if currently breastfeeding.   General: No apparent distress alert and oriented x3 mood and affect normal, dressed appropriately.  HEENT: Pupils equal, extraocular movements intact  Respiratory: Patient's speak in full sentences and does not appear short of breath  Cardiovascular: No lower extremity edema, non tender, no erythema  Right elbow does have swelling noted over the medial aspect.  Patient is tender to palpation over the radial head.  Lacks last 2 degrees of full extension.  Does have full flexion and nearly full supination.  Good grip strength but does cause pain.  Limited muscular skeletal ultrasound was performed and interpreted by Antoine Primas, M  Limited ultrasound shows the patient does have very mild hypoechoic changes over the UCL but no true gapping with dynamic testing.  Patient does have a cortical irregularity noted of the medial condyle.  Mild soft callus formation noted in the area that is consistent with a healing fracture.  Patient also has a radial head fracture noted with cortical irregularity noted but seems to be nondisplaced as well. Impression: Radial head nondisplaced versus  minimally displaced radial head fracture.  Questionable irregularity of the medial epicondyle    Impression and Recommendations:     The above documentation has been reviewed and is accurate and complete Judi Saa, DO

## 2022-05-06 ENCOUNTER — Ambulatory Visit: Payer: Self-pay

## 2022-05-06 ENCOUNTER — Encounter: Payer: Self-pay | Admitting: Family Medicine

## 2022-05-06 ENCOUNTER — Ambulatory Visit (INDEPENDENT_AMBULATORY_CARE_PROVIDER_SITE_OTHER): Payer: 59

## 2022-05-06 ENCOUNTER — Ambulatory Visit: Payer: 59 | Admitting: Family Medicine

## 2022-05-06 VITALS — BP 102/72 | HR 60 | Ht 66.0 in

## 2022-05-06 DIAGNOSIS — M25521 Pain in right elbow: Secondary | ICD-10-CM

## 2022-05-06 DIAGNOSIS — M79601 Pain in right arm: Secondary | ICD-10-CM

## 2022-05-06 DIAGNOSIS — S52124A Nondisplaced fracture of head of right radius, initial encounter for closed fracture: Secondary | ICD-10-CM

## 2022-05-06 DIAGNOSIS — S52121A Displaced fracture of head of right radius, initial encounter for closed fracture: Secondary | ICD-10-CM | POA: Insufficient documentation

## 2022-05-06 NOTE — Assessment & Plan Note (Signed)
Nondisplaced radial head fracture.  Discussed with patient about icing regimen and home exercises, discussed which activities to do and which ones to avoid.  Increase activity slowly otherwise.  Patient is able to do range of motion.  Does appear the patient may have an injury to the medial condyle as well.  Follow-up with me again in3weeks otherwise.  At that time we will get a repeat x-ray.

## 2022-05-06 NOTE — Patient Instructions (Addendum)
Good to see you Arnica lotion K2 daily Xray on the way out Ice 20 min 2x a day Compression if it feels good See me in 4-5 weeks for R elbow pain

## 2022-05-07 ENCOUNTER — Other Ambulatory Visit: Payer: Self-pay

## 2022-05-07 MED ORDER — VITAMIN D (ERGOCALCIFEROL) 1.25 MG (50000 UNIT) PO CAPS: 50000.0000 [IU] | ORAL_CAPSULE | ORAL | 0 refills | Status: DC

## 2022-05-15 ENCOUNTER — Encounter: Payer: Self-pay | Admitting: Obstetrics & Gynecology

## 2022-05-15 ENCOUNTER — Other Ambulatory Visit (HOSPITAL_COMMUNITY)
Admission: RE | Admit: 2022-05-15 | Discharge: 2022-05-15 | Disposition: A | Discharge status: Home / Self Care | Payer: 59 | Source: Ambulatory Visit | Attending: Obstetrics & Gynecology | Admitting: Obstetrics & Gynecology

## 2022-05-15 ENCOUNTER — Ambulatory Visit (INDEPENDENT_AMBULATORY_CARE_PROVIDER_SITE_OTHER): Payer: 59 | Admitting: Obstetrics & Gynecology

## 2022-05-15 VITALS — BP 118/78 | HR 85 | Ht 65.75 in

## 2022-05-15 DIAGNOSIS — Z3049 Encounter for surveillance of other contraceptives: Secondary | ICD-10-CM | POA: Diagnosis not present

## 2022-05-15 DIAGNOSIS — Z01419 Encounter for gynecological examination (general) (routine) without abnormal findings: Secondary | ICD-10-CM

## 2022-05-15 NOTE — Progress Notes (Signed)
M[facility] [birth year] [id number]   History:    46 y.o.  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  Son 96 yo, daughter 28 1/2 yo.   RP:  Established patient presenting for annual gyn exam   HPI:  Menses regular normal every month.  No BTB.  No pelvic pain.  Using the Diaphragm/Spermicides.  No pain with IC. Pap Neg 03/2021.  Pap reflex today. Uterine prolapse minimally symptomatic and stable. Breast Lumpy bilaterally.  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  Screening mammo 04/2022, Lt Dx mammo/US Neg in 05/2022.  Hypothyroidism followed by Endocrino, on Levothyroxine.  Exercising regularly.  Healthy nutrition. Fasting Health [facility] here today.  Will schedule Colono at 46 this year.     Past medical history,surgical history, family history and social history were all reviewed and documented in the EPIC chart.  Gynecologic History Patient's last menstrual period was 04/20/2022 (exact date).  Obstetric History OB History  Gravida Para Term Preterm AB Living  '3 2 2   1 2  ' SAB IAB Ectopic Multiple Live Births  1       2    # Outcome Date GA Lbr Len/2nd Weight Sex Delivery Anes PTL Lv  3 Term 04/30/14 [id number]:31 / 00:16 8 lb 11 oz (3.941 kg) F Vag-Spont EPI  LIV  2 Term 08/27/09 476w0d M Vag-Spont EPI N LIV  1 SAB              ROS: A ROS was performed and pertinent positives and negatives are included in the history.  GENERAL: No fevers or chills. HEENT: No change in vision, no earache, sore throat or sinus congestion. NECK: No pain or stiffness. CARDIOVASCULAR: No chest pain or pressure. No palpitations. PULMONARY: No shortness of breath, cough or wheeze. GASTROINTESTINAL: No abdominal pain, nausea, vomiting or diarrhea, melena or bright red blood per rectum. GENITOURINARY: No urinary frequency, urgency, hesitancy or dysuria. MUSCULOSKELETAL: No joint or muscle pain, no back pain, no recent trauma. DERMATOLOGIC: No rash, no itching, no lesions. ENDOCRINE: No polyuria,  polydipsia, no heat or cold intolerance. No recent change in weight. HEMATOLOGICAL: No anemia or easy bruising or bleeding. NEUROLOGIC: No headache, seizures, numbness, tingling or weakness. PSYCHIATRIC: No depression, no loss of interest in normal activity or change in sleep pattern.     Exam:   BP 118/78   Pulse 85   Ht 5' 5.75" (1.67 m)   LMP 04/20/2022 (Exact Date)   SpO2 98%   BMI 24.72 kg/m   Body mass index is 24.72 kg/m.  General appearance : Well developed well nourished female. No acute distress HEENT: Eyes: no retinal hemorrhage or exudates,  Neck supple, trachea midline, no carotid bruits, no thyroidmegaly Lungs: Clear to auscultation, no rhonchi or wheezes, or rib retractions  Heart: Regular rate and rhythm, no murmurs or gallops Breast:Examined in sitting and supine position were symmetrical in appearance, no palpable masses or tenderness,  no skin retraction, no nipple inversion, no nipple discharge, no skin discoloration, no axillary or supraclavicular lymphadenopathy Abdomen: no palpable masses or tenderness, no rebound or guarding Extremities: no edema or skin discoloration or tenderness  Pelvic: Vulva: Normal             Vagina: No gross lesions or discharge  Cervix: No gross lesions or discharge  Uterus  AV, normal size, shape and consistency, non-tender and mobile  Adnexa  Without masses or tenderness  Anus: Normal   Assessment/Plan:  [age].o. female  for annual exam   1. Encounter for routine gynecological examination with Papanicolaou smear of cervix Menses regular normal every month.  No BTB.  No pelvic pain.  Using the Diaphragm/Spermicides.  No pain with IC. Pap Neg 03/2021.  Pap reflex today. Uterine prolapse minimally symptomatic and stable. Breast Lumpy bilaterally.  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  Screening mammo 04/2022, Lt Dx mammo/US Neg in 05/2022.  Hypothyroidism followed by Endocrino, on Levothyroxine.  Exercising regularly.  Healthy  nutrition. Fasting Health Labs here today.  Will schedule Colono at 46 this year. - CBC - Comp Met (CMET) - Lipid Profile - TSH - Vitamin D (25 hydroxy) - Cytology - PAP( [place])  2. Encounter for surveillance of other contraceptive  Using the Diaphragm/Spermicides.   Other orders - amphetamine-dextroamphetamine (ADDERALL) 15 MG tablet; Take 1 tablet by mouth every morning. (Patient not taking: Reported on 05/15/2022)   Princess Bruins MD, 11:25 AM 05/15/2022

## 2022-05-16 LAB — COMPREHENSIVE METABOLIC PANEL
AG Ratio: 2 (calc) (ref 1.0–2.5)
ALT: 11 U/L (ref 6–29)
AST: 12 U/L (ref 10–30)
Albumin: 4.3 g/dL (ref 3.6–5.1)
Alkaline phosphatase (APISO): 60 U/L (ref 31–125)
BUN: 17 mg/dL (ref 7–25)
CO2: 27 mmol/L (ref 20–32)
Calcium: 9.5 mg/dL (ref 8.6–10.2)
Chloride: 105 mmol/L (ref 98–110)
Creat: 0.87 mg/dL (ref 0.50–0.99)
Globulin: 2.2 g/dL (calc) (ref 1.9–3.7)
Glucose, Bld: 87 mg/dL (ref 65–99)
Potassium: 4.2 mmol/L (ref 3.5–5.3)
Sodium: 139 mmol/L (ref 135–146)
Total Bilirubin: 0.6 mg/dL (ref 0.2–1.2)
Total Protein: 6.5 g/dL (ref 6.1–8.1)

## 2022-05-16 LAB — TSH: TSH: 2.53 mIU/L

## 2022-05-16 LAB — CBC
HCT: 42.2 % (ref 35.0–45.0)
Hemoglobin: 14.3 g/dL (ref 11.7–15.5)
MCH: 30.4 pg (ref 27.0–33.0)
MCHC: 33.9 g/dL (ref 32.0–36.0)
MCV: 89.8 fL (ref 80.0–100.0)
MPV: 10 fL (ref 7.5–12.5)
Platelets: 337 10*3/uL (ref 140–400)
RBC: 4.7 10*6/uL (ref 3.80–5.10)
RDW: 12.2 % (ref 11.0–15.0)
WBC: 6.9 10*3/uL (ref 3.8–10.8)

## 2022-05-16 LAB — LIPID PANEL
Cholesterol: 162 mg/dL (ref <200)
HDL: 52 mg/dL (ref >=50)
LDL Cholesterol (Calc): 94 mg/dL (calc)
Non-HDL Cholesterol (Calc): 110 mg/dL (calc) (ref <130)
Total CHOL/HDL Ratio: 3.1 (calc) (ref <5.0)
Triglycerides: 72 mg/dL (ref <150)

## 2022-05-16 LAB — VITAMIN D 25 HYDROXY (VIT D DEFICIENCY, FRACTURES): Vit D, 25-Hydroxy: 45 ng/mL (ref 30–100)

## 2022-05-20 LAB — CYTOLOGY - PAP: Diagnosis: NEGATIVE

## 2022-05-26 NOTE — Progress Notes (Unsigned)
[facility] [address] [place] [address] Phone: [phone] Subjective:   [facility], am serving as a scribe for Dr. Antoine Primas.  I'm seeing this patient by the request  of:  Mackenzie Davenport, MD  CC: right elbow pain   [id number]  05/06/2022 Nondisplaced radial head fracture.  Discussed with patient about icing regimen and home exercises, discussed which activities to do and which ones to avoid.  Increase activity slowly otherwise.  Patient is able to do range of motion.  Does appear the patient may have an injury to the medial condyle as well.  Follow-up with me again in3weeks otherwise.  At that time we will get a repeat x-ray.  Update 05/27/2022 Mackenzie Boyd is a 46 y.o. female coming in with complaint of R elbow pain. Found to have a radial head fracture. Patient states doing a lot better. Moving in right direction. Can't fully straighten. No new issues.      Past Medical History:  Diagnosis Date   Fractured elbow    Hypothyroidism    Postpartum care following vaginal delivery (7/26) 05/01/2014   Postpartum care following vaginal delivery (7/27) 05/01/2014   No past surgical history on file. Social History   Socioeconomic History   Marital status: Married    Spouse name: Not on file   Number of children: Not on file   Years of education: Not on file   Highest education level: Not on file  Occupational History   Not on file  Tobacco Use   Smoking status: Never   Smokeless tobacco: Never  Vaping Use   Vaping Use: Never used  Substance and Sexual Activity   Alcohol use: Yes    Comment: Rare   Drug use: No   Sexual activity: Yes    Partners: Male    Birth control/protection: Diaphragm, Rhythm    Comment: 1st 1st intercourse- 89, partners- 3, married- 12 yrs  Other Topics Concern   Not on file  Social History Narrative   Not on file   Social Determinants of Health   Financial Resource Strain: Not on file   Food Insecurity: Not on file  Transportation Needs: Not on file  Physical Activity: Not on file  Stress: Not on file  Social Connections: Not on file   No Known Allergies Family History  Problem Relation Age of Onset   Rheum arthritis Sister    Cancer Maternal Grandmother        brain   Cancer Maternal Grandfather        colon   Breast cancer Paternal Grandmother     Current Outpatient Medications (Endocrine & Metabolic):    levothyroxine (SYNTHROID) 75 MCG tablet, Take 75 mcg by mouth daily before breakfast.      Current Outpatient Medications (Other):    amphetamine-dextroamphetamine (ADDERALL) 15 MG tablet, Take 1 tablet by mouth every morning. (Patient not taking: Reported on 05/15/2022)   Vitamin D, Ergocalciferol, (DRISDOL) 1.25 MG (50000 UNIT) CAPS capsule, Take 1 capsule (50,000 Units total) by mouth every 7 (seven) days.   Reviewed prior external information including notes and imaging from  primary care provider As well as notes that were available from care everywhere and other healthcare systems.  Past medical history, social, surgical and family history all reviewed in electronic medical record.  No pertanent information unless stated regarding to the chief complaint.   Review of Systems:  No headache, visual changes, nausea, vomiting, diarrhea, constipation, dizziness, abdominal  pain, skin rash, fevers, chills, night sweats, weight loss, swollen lymph nodes, body aches, joint swelling, chest pain, shortness of breath, mood changes. POSITIVE muscle aches  Objective  Blood pressure 110/80, pulse 61, height 5\' 6"  (1.676 m), last menstrual period 04/20/2022, SpO2 99 %, unknown if currently breastfeeding.   General: No apparent distress alert and oriented x3 mood and affect normal, dressed appropriately.  HEENT: Pupils equal, extraocular movements intact  Respiratory: Patient's speak in full sentences and does not appear short of breath  Cardiovascular: No lower  extremity edema, non tender, no erythema  Elbow exam shows improvement in range of motion.  Still lacks the last 2 degrees of extension.  Patient has no pain and good strength of the elbow noted distally.  Limited muscular skeletal ultrasound was performed and interpreted by 04/22/2022, M  .  Patient does have some calcific changes noted at this time.  Seems to be a hard callus formation noted in the area.  Still has some hypoechoic changes consistent with a effusion of the joint. Impression: Interval improvement    Impression and Recommendations:    The above documentation has been reviewed and is accurate and complete Antoine Primas, DO

## 2022-05-27 ENCOUNTER — Ambulatory Visit: Payer: Self-pay

## 2022-05-27 ENCOUNTER — Ambulatory Visit (INDEPENDENT_AMBULATORY_CARE_PROVIDER_SITE_OTHER): Payer: 59

## 2022-05-27 ENCOUNTER — Ambulatory Visit: Payer: 59 | Admitting: Family Medicine

## 2022-05-27 ENCOUNTER — Encounter: Payer: Self-pay | Admitting: Family Medicine

## 2022-05-27 VITALS — BP 110/80 | HR 61 | Ht 66.0 in

## 2022-05-27 DIAGNOSIS — M25521 Pain in right elbow: Secondary | ICD-10-CM

## 2022-05-27 DIAGNOSIS — S52124A Nondisplaced fracture of head of right radius, initial encounter for closed fracture: Secondary | ICD-10-CM

## 2022-05-27 NOTE — Patient Instructions (Addendum)
Xray today Ice at end of long day Compression sleeve Keep working on ROM

## 2022-05-27 NOTE — Assessment & Plan Note (Signed)
Significant improvement in range of motion.  Still has an effusion noted of the joint.  Patient declined any type of aspiration.  Did discuss with possible icing more regularly and compression could be helpful.  Radial head has findings that is consistent with healing at the moment and should continue to improve.  We will be able to start increasing activity and follow-up with me 1 more time in 3 to 4 weeks continue the vitamin D supplementation.

## 2022-05-29 ENCOUNTER — Ambulatory Visit: Payer: 59 | Admitting: Family Medicine

## 2022-06-26 NOTE — Progress Notes (Deleted)
[facility] [address] [place] [address] Phone: [phone] Subjective:    I'm seeing this patient by the request  of:  Mackenzie Davenport, MD  CC:   EXN:[id number]  05/27/2022 Significant improvement in range of motion.  Still has an effusion noted of the joint.  Patient declined any type of aspiration.  Did discuss with possible icing more regularly and compression could be helpful.  Radial head has findings that is consistent with healing at the moment and should continue to improve.  We will be able to start increasing activity and follow-up with me 1 more time in 3 to 4 weeks continue the vitamin D supplementation.  Updated 06/29/2022 Mackenzie Boyd is a 46 y.o. female coming in with complaint of elbow pain  Xray IMPRESSION: Minimal displaced fracture of the radial head is not changed compared prior exam. No significant callus formation is noted.     Past Medical History:  Diagnosis Date   Fractured elbow    Hypothyroidism    Postpartum care following vaginal delivery (7/26) 05/01/2014   Postpartum care following vaginal delivery (7/27) 05/01/2014   No past surgical history on file. Social History   Socioeconomic History   Marital status: Married    Spouse name: Not on file   Number of children: Not on file   Years of education: Not on file   Highest education level: Not on file  Occupational History   Not on file  Tobacco Use   Smoking status: Never   Smokeless tobacco: Never  Vaping Use   Vaping Use: Never used  Substance and Sexual Activity   Alcohol use: Yes    Comment: Rare   Drug use: No   Sexual activity: Yes    Partners: Male    Birth control/protection: Diaphragm, Rhythm    Comment: 1st 1st intercourse- 66, partners- 3, married- 12 yrs  Other Topics Concern   Not on file  Social History Narrative   Not on file   Social Determinants of Health   Financial Resource Strain: Not on file  Food Insecurity:  Not on file  Transportation Needs: Not on file  Physical Activity: Not on file  Stress: Not on file  Social Connections: Not on file   No Known Allergies Family History  Problem Relation Age of Onset   Rheum arthritis Sister    Cancer Maternal Grandmother        brain   Cancer Maternal Grandfather        colon   Breast cancer Paternal Grandmother     Current Outpatient Medications (Endocrine & Metabolic):    levothyroxine (SYNTHROID) 75 MCG tablet, Take 75 mcg by mouth daily before breakfast.      Current Outpatient Medications (Other):    amphetamine-dextroamphetamine (ADDERALL) 15 MG tablet, Take 1 tablet by mouth every morning. (Patient not taking: Reported on 05/15/2022)   Vitamin D, Ergocalciferol, (DRISDOL) 1.25 MG (50000 UNIT) CAPS capsule, Take 1 capsule (50,000 Units total) by mouth every 7 (seven) days.   Reviewed prior external information including notes and imaging from  primary care provider As well as notes that were available from care everywhere and other healthcare systems.  Past medical history, social, surgical and family history all reviewed in electronic medical record.  No pertanent information unless stated regarding to the chief complaint.   Review of Systems:  No headache, visual changes, nausea, vomiting, diarrhea, constipation, dizziness, abdominal pain, skin rash, fevers, chills, night sweats, weight loss,  swollen lymph nodes, body aches, joint swelling, chest pain, shortness of breath, mood changes. POSITIVE muscle aches  Objective  unknown if currently breastfeeding.   General: No apparent distress alert and oriented x3 mood and affect normal, dressed appropriately.  HEENT: Pupils equal, extraocular movements intact  Respiratory: Patient's speak in full sentences and does not appear short of breath  Cardiovascular: No lower extremity edema, non tender, no erythema      Impression and Recommendations:

## 2022-06-29 ENCOUNTER — Ambulatory Visit: Payer: 59 | Admitting: Family Medicine

## 2022-07-23 ENCOUNTER — Other Ambulatory Visit: Payer: Self-pay | Admitting: Family Medicine

## 2023-05-20 ENCOUNTER — Ambulatory Visit: Payer: 59 | Admitting: Obstetrics & Gynecology

## 2023-07-14 ENCOUNTER — Encounter: Payer: Self-pay | Admitting: Internal Medicine

## 2023-07-14 ENCOUNTER — Ambulatory Visit (AMBULATORY_SURGERY_CENTER): Payer: 59

## 2023-07-14 VITALS — Ht 66.0 in | Wt 160.0 lb

## 2023-07-14 DIAGNOSIS — Z1211 Encounter for screening for malignant neoplasm of colon: Secondary | ICD-10-CM

## 2023-07-14 MED ORDER — NA SULFATE-K SULFATE-MG SULF 17.5-3.13-1.6 GM/177ML PO SOLN: 1.0000 | Freq: Once | ORAL | 0 refills | Status: DC

## 2023-07-14 MED ORDER — NA SULFATE-K SULFATE-MG SULF 17.5-3.13-1.6 GM/177ML PO SOLN: 1.0000 | Freq: Once | ORAL | 0 refills | Status: AC

## 2023-07-14 NOTE — Progress Notes (Signed)

## 2023-07-26 ENCOUNTER — Encounter: Payer: Self-pay | Admitting: Internal Medicine

## 2023-08-05 ENCOUNTER — Ambulatory Visit (AMBULATORY_SURGERY_CENTER): Payer: 59 | Admitting: Internal Medicine

## 2023-08-05 ENCOUNTER — Encounter: Payer: Self-pay | Admitting: Internal Medicine

## 2023-08-05 VITALS — BP 115/73 | HR 58 | Temp 97.3°F | Resp 11 | Ht 66.0 in | Wt 160.0 lb

## 2023-08-05 DIAGNOSIS — Z1211 Encounter for screening for malignant neoplasm of colon: Secondary | ICD-10-CM

## 2023-08-05 MED ORDER — SODIUM CHLORIDE 0.9 % IV SOLN: 500.0000 mL | Freq: Once | INTRAVENOUS | Status: DC

## 2023-08-05 NOTE — Patient Instructions (Signed)
YOU HAD AN ENDOSCOPIC PROCEDURE TODAY AT THE [facility]:   Refer to the procedure report that was given to you for any specific questions about what was found during the examination.  If the procedure report does not answer your questions, please call your gastroenterologist to clarify.  If you requested that your care partner not be given the details of your procedure findings, then the procedure report has been included in a sealed envelope for you to review at your convenience later.  YOU SHOULD EXPECT: Some feelings of bloating in the abdomen. Passage of more gas than usual.  Walking can help get rid of the air that was put into your GI tract during the procedure and reduce the bloating. If you had a lower endoscopy (such as a colonoscopy or flexible sigmoidoscopy) you may notice spotting of blood in your stool or on the toilet paper. If you underwent a bowel prep for your procedure, you may not have a normal bowel movement for a few days.  Please Note:  You might notice some irritation and congestion in your nose or some drainage.  This is from the oxygen used during your procedure.  There is no need for concern and it should clear up in a day or so.  SYMPTOMS TO REPORT IMMEDIATELY:  Following lower endoscopy (colonoscopy or flexible sigmoidoscopy):  Excessive amounts of blood in the stool  Significant tenderness or worsening of abdominal pains  Swelling of the abdomen that is new, acute  Fever of 100F or higher  For urgent or emergent issues, a gastroenterologist can be reached at any hour by calling [phone]. Do not use MyChart messaging for urgent concerns.    DIET:  We do recommend a small meal at first, but then you may proceed to your regular diet.  Drink plenty of fluids but you should avoid alcoholic beverages for 24 hours.  ACTIVITY:  You should plan to take it easy for the rest of today and you should NOT DRIVE or use heavy machinery until tomorrow (because of  the sedation medicines used during the test).    FOLLOW UP: Our staff will call the number listed on your records the next business day following your procedure.  We will call around 7:15- 8:00 am to check on you and address any questions or concerns that you may have regarding the information given to you following your procedure. If we do not reach you, we will leave a message.      SIGNATURES/CONFIDENTIALITY: You and/or your care partner have signed paperwork which will be entered into your electronic medical record.  These signatures attest to the fact that that the information above on your After Visit Summary has been reviewed and is understood.  Full responsibility of the confidentiality of this discharge information lies with you and/or your care-partner. 

## 2023-08-05 NOTE — Progress Notes (Signed)
Sedate, gd SR, tolerated procedure well, VSS, report to RN 

## 2023-08-05 NOTE — Progress Notes (Signed)
Pt's states no medical or surgical changes since previsit or office visit. 

## 2023-08-05 NOTE — Progress Notes (Signed)
GASTROENTEROLOGY PROCEDURE H&P NOTE   Primary Care Physician: Dois Davenport, MD    Reason for Procedure:   Colon cancer screening  Plan:    Colonoscopy  Patient is appropriate for endoscopic procedure(s) in the ambulatory ([facility]) setting.  The nature of the procedure, as well as the risks, benefits, and alternatives were carefully and thoroughly reviewed with the patient. Ample time for discussion and questions allowed. The patient understood, was satisfied, and agreed to proceed.     HPI: Mackenzie Boyd is a 47 y.o. female who presents for colonoscopy for colon cancer screening. Denies blood in stools, changes in bowel habits, or unintentional weight loss. Grandfather had colon cancer at age 76. This is her first colonoscopy.  Past Medical History:  Diagnosis Date   Fractured elbow    Hypothyroidism    Postpartum care following vaginal delivery (7/26) 05/01/2014   Postpartum care following vaginal delivery (7/27) 05/01/2014    History reviewed. No pertinent surgical history.  Prior to Admission medications   Medication Sig Start Date End Date Taking? Authorizing Provider  levothyroxine (SYNTHROID) 75 MCG tablet Take 75 mcg by mouth daily before breakfast.   Yes [provider]  tretinoin (RETIN-A) 0.05 % cream 1 application in the evening to face Externally Once a day for 30 days 11/16/22  Yes [provider]  amphetamine-dextroamphetamine (ADDERALL) 15 MG tablet Take 1 tablet by mouth every morning. Patient not taking: Reported on 05/15/2022 03/12/22   [provider]  Vitamin D, Ergocalciferol, (DRISDOL) 1.25 MG (50000 UNIT) CAPS capsule TAKE 1 CAPSULE (50,000 UNITS TOTAL) BY MOUTH EVERY 7 (SEVEN) DAYS Patient not taking: Reported on 07/14/2023 07/23/22   Judi Saa, DO    Current Outpatient Medications  Medication Sig Dispense Refill   levothyroxine (SYNTHROID) 75 MCG tablet Take 75 mcg by mouth daily before breakfast.     tretinoin  (RETIN-A) 0.05 % cream 1 application in the evening to face Externally Once a day for 30 days     amphetamine-dextroamphetamine (ADDERALL) 15 MG tablet Take 1 tablet by mouth every morning. (Patient not taking: Reported on 05/15/2022)     Vitamin D, Ergocalciferol, (DRISDOL) 1.25 MG (50000 UNIT) CAPS capsule TAKE 1 CAPSULE (50,000 UNITS TOTAL) BY MOUTH EVERY 7 (SEVEN) DAYS (Patient not taking: Reported on 07/14/2023) 12 capsule 0   Current Facility-Administered Medications  Medication Dose Route Frequency Provider Last Rate Last Admin   0.9 %  sodium chloride infusion  500 mL Intravenous Once Imogene Burn, MD        Allergies as of 08/05/2023   (No Known Allergies)    Family History  Problem Relation Age of Onset   Rheum arthritis Sister    Cancer Maternal Grandmother        brain   Colon cancer Maternal Grandfather    Colon polyps Maternal Grandfather    Cancer Maternal Grandfather        colon   Breast cancer Paternal Grandmother    Esophageal cancer Neg Hx    Rectal cancer Neg Hx    Stomach cancer Neg Hx     Social History   Socioeconomic History   Marital status: Married    Spouse name: Not on file   Number of children: Not on file   Years of education: Not on file   Highest education level: Not on file  Occupational History   Not on file  Tobacco Use   Smoking status: Never   Smokeless tobacco: Never  Vaping Use   Vaping status: Never Used  Substance and Sexual Activity   Alcohol use: Yes    Comment: Rare   Drug use: No   Sexual activity: Yes    Partners: Male    Birth control/protection: Diaphragm, Rhythm    Comment: 1st 1st intercourse- 18, partners- 3, married- 12 yrs  Other Topics Concern   Not on file  Social History Narrative   Not on file   Social Determinants of Health   Financial Resource Strain: Not on file  Food Insecurity: Not on file  Transportation Needs: Not on file  Physical Activity: Not on file  Stress: Not on file  Social  Connections: Not on file  Intimate Partner Violence: Not on file    Physical Exam: Vital signs in last 24 hours: BP 120/86   Pulse 72   Temp (!) 97.3 F (36.3 C)   Ht 5\' 6"  (1.676 m)   Wt 160 lb (72.6 kg)   LMP 07/22/2023 (Exact Date)   SpO2 97%   BMI 25.82 kg/m  GEN: NAD EYE: Sclerae anicteric ENT: MMM CV: Non-tachycardic Pulm: No increased work of breathing GI: Soft, NT/ND NEURO:  Alert & Oriented   Eulah Pont, MD [facility]  08/05/2023 8:53 AM

## 2023-08-05 NOTE — Op Note (Signed)
[facility] Patient Name: Mackenzie Boyd Procedure Date: 08/05/2023 8:55 AM MRN: [id number] Endoscopist: Madelyn Brunner [place] , , [id number] Age: 47 Referring MD:  Date of Birth: 1976/02/26 Gender: Female Account #: [id number] Procedure:                Colonoscopy Indications:              Screening for colorectal malignant neoplasm, This                            is the patient's first colonoscopy Medicines:                Monitored Anesthesia Care Procedure:                Pre-Anesthesia Assessment:                           - Prior to the procedure, a History and Physical                            was performed, and patient medications and                            allergies were reviewed. The patient's tolerance of                            previous anesthesia was also reviewed. The risks                            and benefits of the procedure and the sedation                            options and risks were discussed with the patient.                            All questions were answered, and informed consent                            was obtained. Prior Anticoagulants: The patient has                            taken no anticoagulant or antiplatelet agents. ASA                            Grade Assessment: I - A normal, healthy patient.                            After reviewing the risks and benefits, the patient                            was deemed in satisfactory condition to undergo the                            procedure.  After obtaining informed consent, the colonoscope                            was passed under direct vision. Throughout the                            procedure, the patient's blood pressure, pulse, and                            oxygen saturations were monitored continuously. The                            Olympus Scope [id number] was introduced through the                            anus and advanced to the the  terminal ileum. The                            colonoscopy was performed without difficulty. The                            patient tolerated the procedure well. The quality                            of the bowel preparation was excellent. The                            terminal ileum, ileocecal valve, appendiceal                            orifice, and rectum were photographed. Scope In: 9:05:44 AM Scope Out: 9:22:26 AM Scope Withdrawal Time: 0 hours 13 minutes 6 seconds  Total Procedure Duration: 0 hours 16 minutes 42 seconds  Findings:                 The terminal ileum appeared normal.                           Non-bleeding internal hemorrhoids were found during                            retroflexion. Complications:            No immediate complications. Estimated Blood Loss:     Estimated blood loss was minimal. Impression:               - The examined portion of the ileum was normal.                           - Non-bleeding internal hemorrhoids.                           - No specimens collected. Recommendation:           - Discharge patient to home (with escort).                           -  Repeat colonoscopy in 10 years for screening                            purposes.                           - The findings and recommendations were discussed                            with the patient. Dr Particia Lather "[place]" [name],  08/05/2023 9:32:08 AM

## 2023-08-06 ENCOUNTER — Telehealth: Payer: Self-pay

## 2023-08-06 NOTE — Telephone Encounter (Signed)
  Follow up Call-     08/05/2023    8:27 AM  Call back number  Post procedure Call Back phone  # [phone]  Permission to leave phone message Yes     Patient questions:  Do you have a fever, pain , or abdominal swelling? No. Pain Score  0 *  Have you tolerated food without any problems? Yes.    Have you been able to return to your normal activities? Yes.    Do you have any questions about your discharge instructions: Diet   No. Medications  No. Follow up visit  No.  Do you have questions or concerns about your Care? No.  Actions: * If pain score is 4 or above: No action needed, pain <4.
